# Patient Record
Sex: Female | Born: 1970 | Race: White | Hispanic: Yes | Marital: Married | Smoking: Never smoker
Health system: Southern US, Community
[De-identification: ages and names within clinical notes are randomized; demographics above are authoritative.]

---

## 1999-10-22 ENCOUNTER — Other Ambulatory Visit: Admission: RE | Admit: 1999-10-22 | Discharge: 1999-10-22 | Payer: Self-pay | Admitting: Obstetrics & Gynecology

## 2000-02-13 ENCOUNTER — Encounter: Admission: RE | Admit: 2000-02-13 | Discharge: 2000-02-13 | Payer: Self-pay | Admitting: Obstetrics and Gynecology

## 2000-02-13 ENCOUNTER — Encounter: Payer: Self-pay | Admitting: Obstetrics and Gynecology

## 2000-04-12 ENCOUNTER — Encounter (INDEPENDENT_AMBULATORY_CARE_PROVIDER_SITE_OTHER): Payer: Self-pay | Admitting: Specialist

## 2000-04-12 ENCOUNTER — Encounter: Payer: Self-pay | Admitting: Obstetrics

## 2000-04-12 ENCOUNTER — Inpatient Hospital Stay (HOSPITAL_COMMUNITY): Admission: AD | Admit: 2000-04-12 | Discharge: 2000-04-15 | Payer: Self-pay | Admitting: Obstetrics

## 2002-05-05 ENCOUNTER — Other Ambulatory Visit: Admission: RE | Admit: 2002-05-05 | Discharge: 2002-05-05 | Payer: Self-pay | Admitting: Obstetrics and Gynecology

## 2002-09-20 ENCOUNTER — Encounter: Admission: RE | Admit: 2002-09-20 | Discharge: 2002-09-20 | Payer: Self-pay | Admitting: *Deleted

## 2002-09-25 ENCOUNTER — Ambulatory Visit (HOSPITAL_COMMUNITY): Admission: RE | Admit: 2002-09-25 | Discharge: 2002-09-25 | Payer: Self-pay | Admitting: *Deleted

## 2002-09-27 ENCOUNTER — Encounter: Admission: RE | Admit: 2002-09-27 | Discharge: 2002-09-27 | Payer: Self-pay | Admitting: *Deleted

## 2002-10-11 ENCOUNTER — Encounter: Admission: RE | Admit: 2002-10-11 | Discharge: 2002-10-11 | Payer: Self-pay | Admitting: *Deleted

## 2002-10-25 ENCOUNTER — Encounter: Admission: RE | Admit: 2002-10-25 | Discharge: 2002-10-25 | Payer: Self-pay | Admitting: *Deleted

## 2002-10-25 ENCOUNTER — Ambulatory Visit (HOSPITAL_COMMUNITY): Admission: RE | Admit: 2002-10-25 | Discharge: 2002-10-25 | Payer: Self-pay | Admitting: *Deleted

## 2002-11-01 ENCOUNTER — Encounter (HOSPITAL_COMMUNITY): Admission: RE | Admit: 2002-11-01 | Discharge: 2002-11-15 | Payer: Self-pay | Admitting: *Deleted

## 2002-11-01 ENCOUNTER — Encounter: Admission: RE | Admit: 2002-11-01 | Discharge: 2002-11-01 | Payer: Self-pay | Admitting: *Deleted

## 2002-11-15 ENCOUNTER — Encounter: Admission: RE | Admit: 2002-11-15 | Discharge: 2002-11-15 | Payer: Self-pay | Admitting: *Deleted

## 2002-11-20 ENCOUNTER — Encounter (INDEPENDENT_AMBULATORY_CARE_PROVIDER_SITE_OTHER): Payer: Self-pay | Admitting: Specialist

## 2002-11-20 ENCOUNTER — Inpatient Hospital Stay (HOSPITAL_COMMUNITY): Admission: AD | Admit: 2002-11-20 | Discharge: 2002-11-23 | Payer: Self-pay | Admitting: Obstetrics and Gynecology

## 2002-11-27 ENCOUNTER — Inpatient Hospital Stay (HOSPITAL_COMMUNITY): Admission: AD | Admit: 2002-11-27 | Discharge: 2002-11-27 | Payer: Self-pay | Admitting: Family Medicine

## 2003-01-02 ENCOUNTER — Encounter: Admission: RE | Admit: 2003-01-02 | Discharge: 2003-01-02 | Payer: Self-pay | Admitting: Obstetrics and Gynecology

## 2003-01-16 ENCOUNTER — Encounter: Admission: RE | Admit: 2003-01-16 | Discharge: 2003-01-16 | Payer: Self-pay | Admitting: Obstetrics and Gynecology

## 2005-09-11 ENCOUNTER — Other Ambulatory Visit: Admission: RE | Admit: 2005-09-11 | Discharge: 2005-09-11 | Payer: Self-pay | Admitting: Gynecology

## 2007-12-31 ENCOUNTER — Emergency Department (HOSPITAL_COMMUNITY): Admission: EM | Admit: 2007-12-31 | Discharge: 2007-12-31 | Payer: Self-pay | Admitting: Emergency Medicine

## 2011-01-09 NOTE — Discharge Summary (Signed)
[facility]  Patient:    Sydney Sandoval, Sydney Sandoval                         MRN: [id number] Adm. Date:  [id number] Disc. Date: [id number] Attending:  Tammi Sou Dictator:   Pricilla Holm, M.D.                           Discharge Summary  PROCEDURES:                   Primary low transverse cesarean section.  HOSPITAL COURSE:              Ms. Duba is a 40 year old Hispanic female who presented to maternity admissions from clinic.  She was admitted for induction of labor at 37-2/7 for severe oligohydramnios and symmetric IUGR.  She was given Cervidil overnight for cervical ripening.  The patient was found to have fetal bradycardia down to approximately 60 beats per minute x 8 minutes which was preceded by late deceleration.  Decision was made to proceed to C section. The patient received a primary low transverse cesarean section, surgeon Dr. Tamela Oddi with findings of viable female with Apgars of 8 at 1 minute, 9 at 5 minutes.  The patient had no complications following the C section, and she was taken to recovery.  The patient received routine postoperative care. She did not desire circumcision for her female.  She has been both breast and bottle feeding and desire oral contraceptive pills upon discharge.  On the morning of August 23, the patient was found to be doing well.  She will have her staples removed today prior to leaving to go home.  Her female will continue to stay in the NICU secondary to apnea.  DISCHARGE CONDITION:          Good.  DISPOSITION:                  Discharged home with family.  DISCHARGE MEDICATIONS:        1. Micronor 1 tablet p.o. q.d. as directed,                                  starting September 3.                               2. Tylox 1 tablet p.o. q.4-6h. p.r.n. pain.                               3. Motrin 600 mg 1 tablet p.o. q.6h. p.r.n.                                  pain.  DISCHARGE INSTRUCTIONS:        The patient was given instructions on activity, diet, wound care, as well as symptoms to warrant further treatment.  FOLLOWUP:                     The patient will follow up in six weeks with [facility].  The patient voiced agreement and consent to above plan and had no further questions. DD:  04/15/00 TD:  04/16/00 Job: [id number]

## 2011-01-09 NOTE — Discharge Summary (Signed)
   Sydney Sandoval, Sydney Sandoval                     ACCOUNT NO.:  [id number]   MEDICAL RECORD NO.:  [id number]                   PATIENT TYPE:  INP   LOCATION:  9122                                 FACILITY:  [facility]   PHYSICIAN:  Phil D. Okey Dupre, M.D.                  DATE OF BIRTH:  09-Jan-1971   DATE OF ADMISSION:  11/20/2002  DATE OF DISCHARGE:  11/23/2002                                 DISCHARGE SUMMARY   HISTORY:  The patient is 40 year old gravida 2, para 1-0-0-1 at the time of  admission, had a previous C-section, was admitted for a repeat cesarean  section for a twin pregnancy at term.  She underwent low-transverse cesarean  section on the day of admission.  Two female twins both vertex presentation.  The first one 5 pounds 15 ounces, second 5 pounds 12 ounces.  Patient a  completely non-eventful postoperative course, completely afebrile and was  discharged on the a.m. of the third postoperative day to home.   DISCHARGE INSTRUCTIONS:  Given to the patient as to activity, follow up and  diet.   DISCHARGE PHYSICAL EXAMINATION:  VITAL SIGNS:  At the time of discharge she  was afebrile with a blood pressure of 120/80, 76 pulse, temperature 98.6.  LUNGS:  Clear to auscultation percussion.  HEART:  No murmur.  Normal sinus rhythm.  ABDOMEN:  Soft.  Flat.  Normally tender for third day post C-section with a  clean, well-healing incision.  Staples had been removed on the day of  discharge.   FOLLOW UP:  She will be followed up in [facility] in six weeks.   DISCHARGE MEDICATIONS:  She is discharged on Percocet for pain.                                                Phil D. Okey Dupre, M.D.    PDR/MEDQ  D:  12/27/2002  T:  12/27/2002  Job:  [id number]

## 2011-01-09 NOTE — Op Note (Signed)
[facility]  Patient:    Sydney Sandoval, Sydney Sandoval                         MRN: [id number] Proc. Date: 04/13/00 Adm. Date:  [id number] Disc. Date: [id number] Attending:  Tammi Sou                           Operative Report  PREOPERATIVE DIAGNOSES:       1. 37 week intrauterine pregnancy.                               2. Oligohydramnios.                               3. Nonreassuring fetal heart tracing with late                                  decelerations and prolonged deceleration.  POSTOPERATIVE DIAGNOSES:      1. 37 week intrauterine pregnancy.                               2. Oligohydramnios.                               3. Nonreassuring fetal heart tracing with late                                  decelerations and prolonged deceleration.  PROCEDURES:                   Primary low transverse cesarean section via Pfannenstiel.  SURGEON:                      Roseanna Rainbow, M.D.  ANESTHESIA:                   Spinal.  COMPLICATIONS:                None.  ESTIMATED BLOOD LOSS:         800 cc.  FLUIDS:                       1500 cc of lactated Ringers.  URINE OUTPUT:                 300 cc of clear urine at the end of the procedure.  INDICATIONS:                  The patient is a 40 year old, G1, P0, at 37 weeks with oligohydramnios, undergoing induction.  With cervical ripening with Cervidil, the patient began having uterine contractions without any cervical dilatation.  The fetal heart tracing was reviewed and there were late decelerations followed by an 8 minute prolonged deceleration with a nadir below 60.  There was recovery after this deceleration without a significant tachycardia.  As delivery was remote, the decision was made to proceed with a primary cesarean delivery.  FINDINGS:  Female infant in cephalic presentation with a nuchal cord, loose.  Pediatrics were present at delivery.  Normal  uterus, tubes, and ovaries.  DESCRIPTION OF PROCEDURE:     The patient was taken to the operating room where a spinal anesthetic was administered and found to be adequate.  She was then prepared and draped in the normal sterile fashion with the dorsosupine position with a leftward tilt.  A Pfannenstiel skin incision was then made with the scalpel and carried through to the underlying layer of fascia.  The fascia was incised in the midline and the incision extended laterally with Mayo scissors.  The superior aspect of the fascia incision was then grasped with Kocher clamps, elevated, and the underlying rectus muscles dissected off. Attention was then turned to the inferior aspect of this incision, which was manipulated in a similar fashion.  The rectus fascia was separated in the midline and the peritoneum identified, tented up, and entered sharply with Metzenbaum scissors.  The peritoneal incision was then extended superiorly and inferiorly with good visualization of the bladder.  The bladder blade was then inserted and the vesicouterine peritoneum identified, grasped with pickups, and entered sharply.  This incision was then extended laterally and the bladder flap created digitally.  The bladder blade was then reinserted in the lower uterine segment and incised in a transverse fashion with the scalpel. The uterine incision was then extended laterally with the bandage scissors. The bladder blade was then removed and the infants head delivered atraumatically.  The nose and mouth were suctioned with the bulb suction and the cord clamped and cut.  The infant was handed off to the awaiting pediatricians.  Cord gases were sent.  The placenta was then removed.  The uterus was exteriorized and cleared of all clots and debris.  The uterine incision was then repaired with 0 Monocryl in a running lock fashion.  The uterus was returned to the abdomen.  The gutters were cleared of all clot. The  fascia was reapproximated with 0 Vicryl in a running fashion.  The skin was closed with staples.  The patient tolerated the procedure well.  The sponge, lap, and needle counts were correct x 2.  One gram of cefazolin was given at cord clamp.  The patient was taken to the PACU in stable condition. DD:  04/15/00 TD:  04/16/00 Job: [id number]

## 2011-01-09 NOTE — Op Note (Signed)
   Sydney Sandoval, Sydney Sandoval                     ACCOUNT NO.:  [id number]   MEDICAL RECORD NO.:  [id number]                   PATIENT TYPE:  INP   LOCATION:  9198                                 FACILITY:  [facility]   PHYSICIAN:  Phil D. Okey Dupre, M.D.                  DATE OF BIRTH:  07/26/1971   DATE OF PROCEDURE:  11/20/2002  DATE OF DISCHARGE:                                 OPERATIVE REPORT   PROCEDURE:  Low transverse cesarean section.   PREOPERATIVE DIAGNOSES:  1. Previous cesarean section.  2. Term twin pregnancy.   POSTOPERATIVE DIAGNOSES:  1. Previous cesarean section.  2. Term twin pregnancy.   SURGEON:  Javier Glazier. Okey Dupre, M.D.   OPERATIVE FINDINGS:  Twin females.  A weighing 5 pounds 15 ounces.  B  weighing 5 pounds 12 ounces.  Both in vertex presentations.   PROCEDURE:  Under satisfactory spinal anesthesia the patient in a dorsal  supine position with a Foley catheter in the urinary bladder the abdomen was  prepped and draped in the usual sterile manner, entered through a previous  transverse incisional scar 3 cm above the symphysis pubis and extending for  a total length of 16 cm.  The abdomen was entered by layers and entered into  the peritoneal cavity.  The visceroperitoneum on the anterior surface of the  uterus opened transversely by sharp dissection and the bladder pushed away  from the lower uterine segment was entered by sharp and blunt dissection and  from an LOT presentation the first twin was easily delivered.  Cord doubly  clamped, divided, the baby handed to pediatrician.  The second baby came  down entering ROT presentation and was easily delivered.  Cord doubly  clamped, divided, baby handed to pediatrician.  Samples of blood taken from  each individual cord.  The placenta was spontaneously removed and the uterus  explored and closed with a continuous running locked 0 Vicryl on an  atraumatic needle.  Area was observed for bleeding, none was noted and the  fascia closed from either incision meeting at the mid point with continuous  alternating locked 0 Vicryl on an atraumatic needle.  Subcutaneous bleeders  were controlled with hot cautery and skin staples for skin edge  approximation.  Dry sterile dressing was applied.  The patient tolerated the  procedure well with about 800 mL blood loss and was transferred to recovery  room in satisfactory condition.                                               Phil D. Okey Dupre, M.D.    PDR/MEDQ  D:  11/20/2002  T:  11/20/2002  Job:  [id number]

## 2014-09-28 ENCOUNTER — Other Ambulatory Visit: Payer: Self-pay | Admitting: Specialist

## 2014-09-28 ENCOUNTER — Ambulatory Visit
Admission: RE | Admit: 2014-09-28 | Discharge: 2014-09-28 | Disposition: A | Discharge status: Home / Self Care | Payer: No Typology Code available for payment source | Source: Ambulatory Visit | Attending: Specialist | Admitting: Specialist

## 2014-09-28 DIAGNOSIS — R7611 Nonspecific reaction to tuberculin skin test without active tuberculosis: Secondary | ICD-10-CM

## 2016-03-08 IMAGING — CR DG CHEST 1V
1 series · 1 of 1 positions shown · non-contrast
Comparison: None.

CLINICAL DATA: 43-year-old female with positive TB test. Evaluate
for tuberculosis.

EXAM:
CHEST  1 VIEW

[view not recorded]
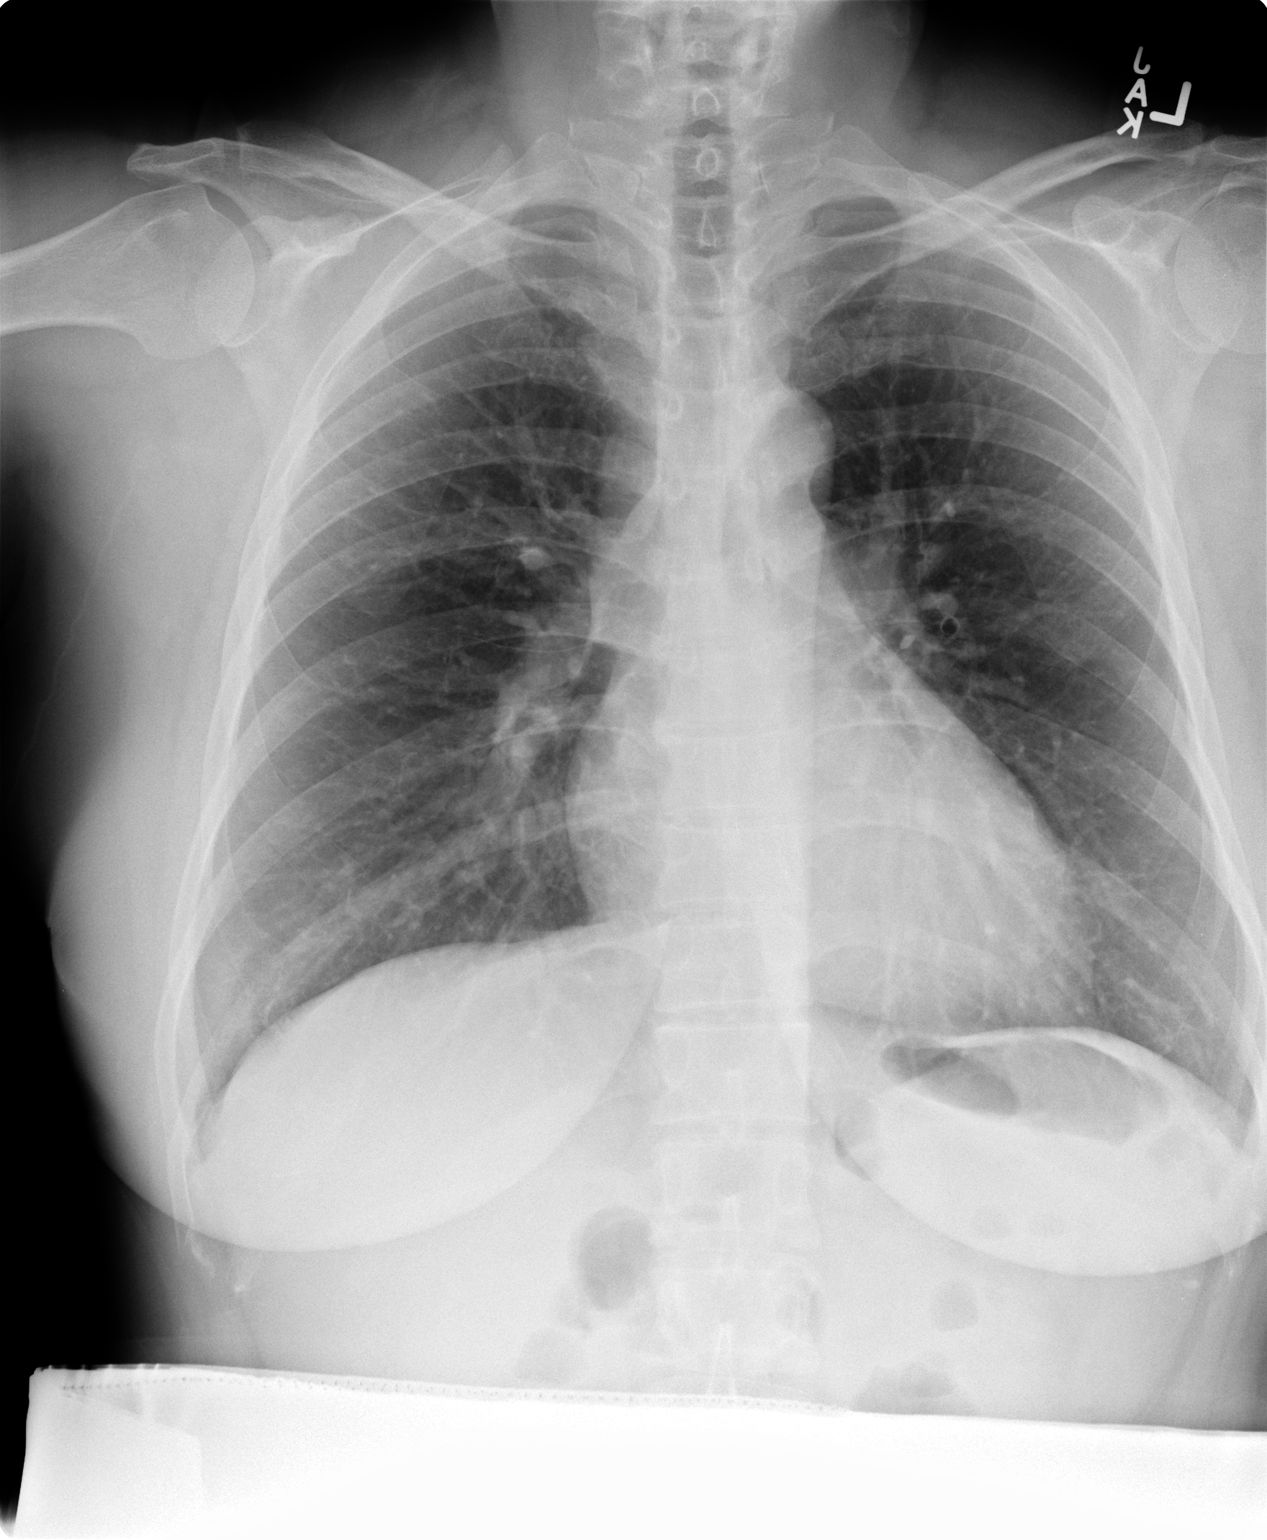

[1 of 1 positions shown; findings below may reference images not displayed]

FINDINGS: The lungs are clear and negative for focal airspace consolidation,
pulmonary edema or suspicious pulmonary nodule. No pleural effusion
or pneumothorax. Cardiac and mediastinal contours are within normal
limits. No acute fracture or lytic or blastic osseous lesions. The
visualized upper abdominal bowel gas pattern is unremarkable.
IMPRESSION: Negative chest x-ray.

## 2016-08-14 ENCOUNTER — Telehealth: Payer: Self-pay | Admitting: Licensed Clinical Social Worker

## 2016-08-14 NOTE — Progress Notes (Signed)
LCSW received call from patient via Vassie LollAlva her interpreter.   She wanting LCSW to assist her with getting a message to her doctor states she wants to change doctors.  In reviewing patient's chart [facility]  Informed patient that LCSW was not connected to her PCP office.  She states she dialed the wrong case worker.     Sammuel Hineseborah Skarlette Lattner, LCSW Licensed Clinical Social Worker [facility]   [phone] 3:25 PM

## 2018-04-12 ENCOUNTER — Other Ambulatory Visit: Payer: Self-pay

## 2018-04-12 ENCOUNTER — Emergency Department (HOSPITAL_BASED_OUTPATIENT_CLINIC_OR_DEPARTMENT_OTHER): Payer: Self-pay

## 2018-04-12 ENCOUNTER — Encounter (HOSPITAL_BASED_OUTPATIENT_CLINIC_OR_DEPARTMENT_OTHER): Payer: Self-pay

## 2018-04-12 ENCOUNTER — Inpatient Hospital Stay (HOSPITAL_BASED_OUTPATIENT_CLINIC_OR_DEPARTMENT_OTHER)
Admission: EM | Admit: 2018-04-12 | Discharge: 2018-04-16 | DRG: 418 | Disposition: A | Discharge status: Home / Self Care | Payer: Self-pay | Attending: Internal Medicine | Admitting: Internal Medicine

## 2018-04-12 DIAGNOSIS — K8062 Calculus of gallbladder and bile duct with acute cholecystitis without obstruction: Principal | ICD-10-CM | POA: Diagnosis present

## 2018-04-12 DIAGNOSIS — D509 Iron deficiency anemia, unspecified: Secondary | ICD-10-CM | POA: Diagnosis present

## 2018-04-12 DIAGNOSIS — R945 Abnormal results of liver function studies: Secondary | ICD-10-CM

## 2018-04-12 DIAGNOSIS — R1011 Right upper quadrant pain: Secondary | ICD-10-CM

## 2018-04-12 DIAGNOSIS — D62 Acute posthemorrhagic anemia: Secondary | ICD-10-CM | POA: Diagnosis present

## 2018-04-12 DIAGNOSIS — K760 Fatty (change of) liver, not elsewhere classified: Secondary | ICD-10-CM | POA: Diagnosis present

## 2018-04-12 DIAGNOSIS — R7989 Other specified abnormal findings of blood chemistry: Secondary | ICD-10-CM | POA: Diagnosis present

## 2018-04-12 DIAGNOSIS — R933 Abnormal findings on diagnostic imaging of other parts of digestive tract: Secondary | ICD-10-CM

## 2018-04-12 DIAGNOSIS — Z419 Encounter for procedure for purposes other than remedying health state, unspecified: Secondary | ICD-10-CM

## 2018-04-12 DIAGNOSIS — Z6832 Body mass index (BMI) 32.0-32.9, adult: Secondary | ICD-10-CM

## 2018-04-12 DIAGNOSIS — E876 Hypokalemia: Secondary | ICD-10-CM | POA: Diagnosis present

## 2018-04-12 DIAGNOSIS — Z98891 History of uterine scar from previous surgery: Secondary | ICD-10-CM

## 2018-04-12 DIAGNOSIS — E669 Obesity, unspecified: Secondary | ICD-10-CM | POA: Diagnosis present

## 2018-04-12 DIAGNOSIS — K810 Acute cholecystitis: Secondary | ICD-10-CM

## 2018-04-12 DIAGNOSIS — N920 Excessive and frequent menstruation with regular cycle: Secondary | ICD-10-CM | POA: Diagnosis present

## 2018-04-12 DIAGNOSIS — K8050 Calculus of bile duct without cholangitis or cholecystitis without obstruction: Secondary | ICD-10-CM

## 2018-04-12 DIAGNOSIS — M1990 Unspecified osteoarthritis, unspecified site: Secondary | ICD-10-CM | POA: Diagnosis present

## 2018-04-12 DIAGNOSIS — R17 Unspecified jaundice: Secondary | ICD-10-CM | POA: Diagnosis present

## 2018-04-12 LAB — COMPREHENSIVE METABOLIC PANEL
ALT: 882 U/L — ABNORMAL HIGH (ref 0–44)
AST: 834 U/L — AB (ref 15–41)
Albumin: 4.3 g/dL (ref 3.5–5.0)
Alkaline Phosphatase: 325 U/L — ABNORMAL HIGH (ref 38–126)
Anion gap: 10 (ref 5–15)
BUN: 10 mg/dL (ref 6–20)
CHLORIDE: 107 mmol/L (ref 98–111)
CO2: 25 mmol/L (ref 22–32)
Calcium: 9.2 mg/dL (ref 8.9–10.3)
Creatinine, Ser: 0.38 mg/dL — ABNORMAL LOW (ref 0.44–1.00)
GFR calc Af Amer: >60 mL/min (ref >60)
GFR calc non Af Amer: >60 mL/min (ref >60)
Glucose, Bld: 98 mg/dL (ref 70–99)
Potassium: 3.4 mmol/L — ABNORMAL LOW (ref 3.5–5.1)
Sodium: 142 mmol/L (ref 135–145)
Total Bilirubin: 2.6 mg/dL — ABNORMAL HIGH (ref 0.3–1.2)
Total Protein: 8 g/dL (ref 6.5–8.1)

## 2018-04-12 LAB — CBC WITH DIFFERENTIAL/PLATELET
Basophils Absolute: 0 10*3/uL (ref 0.0–0.1)
Basophils Relative: 0 %
EOS ABS: 0 10*3/uL (ref 0.0–0.7)
Eosinophils Relative: 0 %
HCT: 28.6 % — ABNORMAL LOW (ref 36.0–46.0)
Hemoglobin: 8.3 g/dL — ABNORMAL LOW (ref 12.0–15.0)
LYMPHS ABS: 1.2 10*3/uL (ref 0.7–4.0)
Lymphocytes Relative: 9 %
MCH: 17.1 pg — ABNORMAL LOW (ref 26.0–34.0)
MCHC: 29 g/dL — ABNORMAL LOW (ref 30.0–36.0)
MCV: 59.1 fL — ABNORMAL LOW (ref 78.0–100.0)
Monocytes Absolute: 0.9 10*3/uL (ref 0.1–1.0)
Monocytes Relative: 7 %
NEUTROS PCT: 84 %
Neutro Abs: 11 10*3/uL — ABNORMAL HIGH (ref 1.7–7.7)
Platelets: 415 10*3/uL — ABNORMAL HIGH (ref 150–400)
RBC: 4.84 MIL/uL (ref 3.87–5.11)
RDW: 21.1 % — ABNORMAL HIGH (ref 11.5–15.5)
WBC: 13.1 10*3/uL — AB (ref 4.0–10.5)

## 2018-04-12 LAB — URINALYSIS, ROUTINE W REFLEX MICROSCOPIC
BILIRUBIN URINE: NEGATIVE
Glucose, UA: NEGATIVE mg/dL
KETONES UR: 15 mg/dL — AB
LEUKOCYTES UA: NEGATIVE
NITRITE: NEGATIVE
Protein, ur: NEGATIVE mg/dL
Specific Gravity, Urine: 1.02 (ref 1.005–1.030)
pH: 6 (ref 5.0–8.0)

## 2018-04-12 LAB — URINALYSIS, MICROSCOPIC (REFLEX): WBC, UA: NONE SEEN WBC/hpf (ref 0–5)

## 2018-04-12 LAB — LIPASE, BLOOD: Lipase: 133 U/L — ABNORMAL HIGH (ref 11–51)

## 2018-04-12 LAB — PREGNANCY, URINE: PREG TEST UR: NEGATIVE

## 2018-04-12 MED ORDER — FAMOTIDINE IN NACL 20-0.9 MG/50ML-% IV SOLN
20.0000 mg | Freq: Two times a day (BID) | INTRAVENOUS | Status: DC
  Administered 2018-04-12 – 2018-04-16 (×7): 20 mg via INTRAVENOUS
  Filled 2018-04-12 (×7): qty 50

## 2018-04-12 MED ORDER — ONDANSETRON HCL 4 MG PO TABS: 4.0000 mg | ORAL_TABLET | Freq: Four times a day (QID) | ORAL | Status: DC | PRN

## 2018-04-12 MED ORDER — METRONIDAZOLE IN NACL 5-0.79 MG/ML-% IV SOLN
500.0000 mg | Freq: Three times a day (TID) | INTRAVENOUS | Status: DC
  Administered 2018-04-12 – 2018-04-16 (×10): 500 mg via INTRAVENOUS
  Filled 2018-04-12 (×11): qty 100

## 2018-04-12 MED ORDER — SODIUM CHLORIDE 0.9 % IV SOLN
INTRAVENOUS | Status: DC | PRN
  Administered 2018-04-12: 250 mL via INTRAVENOUS
  Administered 2018-04-13: 1000 mL via INTRAVENOUS

## 2018-04-12 MED ORDER — FENTANYL CITRATE (PF) 100 MCG/2ML IJ SOLN
25.0000 ug | INTRAMUSCULAR | Status: DC | PRN
  Administered 2018-04-14: 25 ug via INTRAVENOUS
  Filled 2018-04-12: qty 2

## 2018-04-12 MED ORDER — POTASSIUM CHLORIDE IN NACL 20-0.9 MEQ/L-% IV SOLN
INTRAVENOUS | Status: AC
  Administered 2018-04-12: 23:00:00 via INTRAVENOUS
  Filled 2018-04-12: qty 1000

## 2018-04-12 MED ORDER — ACETAMINOPHEN 325 MG PO TABS
650.0000 mg | ORAL_TABLET | Freq: Four times a day (QID) | ORAL | Status: DC | PRN
  Administered 2018-04-12 – 2018-04-13 (×2): 650 mg via ORAL
  Filled 2018-04-12 (×2): qty 2

## 2018-04-12 MED ORDER — POTASSIUM CHLORIDE CRYS ER 20 MEQ PO TBCR
20.0000 meq | EXTENDED_RELEASE_TABLET | Freq: Once | ORAL | Status: AC
  Administered 2018-04-12: 20 meq via ORAL
  Filled 2018-04-12: qty 1

## 2018-04-12 MED ORDER — ONDANSETRON HCL 4 MG/2ML IJ SOLN
4.0000 mg | Freq: Once | INTRAMUSCULAR | Status: AC
  Administered 2018-04-14: 4 mg via INTRAVENOUS
  Filled 2018-04-12 (×2): qty 2

## 2018-04-12 MED ORDER — SODIUM CHLORIDE 0.9 % IV SOLN
2.0000 g | Freq: Once | INTRAVENOUS | Status: AC
  Administered 2018-04-12: 2 g via INTRAVENOUS
  Filled 2018-04-12: qty 20

## 2018-04-12 MED ORDER — SODIUM CHLORIDE 0.9 % IV BOLUS
1000.0000 mL | Freq: Once | INTRAVENOUS | Status: AC
  Administered 2018-04-12: 1000 mL via INTRAVENOUS

## 2018-04-12 MED ORDER — SODIUM CHLORIDE 0.9 % IV SOLN
2.0000 g | INTRAVENOUS | Status: DC
  Administered 2018-04-14 – 2018-04-15 (×3): 2 g via INTRAVENOUS
  Filled 2018-04-12 (×2): qty 2
  Filled 2018-04-12: qty 20
  Filled 2018-04-12: qty 2
  Filled 2018-04-12: qty 20

## 2018-04-12 MED ORDER — ONDANSETRON HCL 4 MG/2ML IJ SOLN: 4.0000 mg | Freq: Four times a day (QID) | INTRAMUSCULAR | Status: DC | PRN

## 2018-04-12 MED ORDER — ACETAMINOPHEN 650 MG RE SUPP: 650.0000 mg | Freq: Four times a day (QID) | RECTAL | Status: DC | PRN

## 2018-04-12 NOTE — ED Notes (Signed)
Pt does not want zofran at this time. States she is not nauseated.

## 2018-04-12 NOTE — ED Notes (Signed)
Pt refused zofran for now

## 2018-04-12 NOTE — ED Notes (Signed)
Report given to Electronic Data Systemsachel RN 5 west

## 2018-04-12 NOTE — ED Provider Notes (Signed)
[facility] EMERGENCY DEPARTMENT Provider Note   CSN: [id number] Arrival date & time: 04/12/18  1557     History   Chief Complaint Chief Complaint  Patient presents with  . Abdominal Pain    HPI Sydney Sandoval is a 47 y.o. female.  HPI Sydney Sandoval is a 47 y.o. female presents to emergency department with complaint of abdominal pain, nausea, vomiting.  Patient states her pain started yesterday.  She states it improved and she was able to sleep, but it worsened again this morning.  Today she has had several episodes of nausea and vomiting.  She states she is unable to eat.  Denies any diarrhea or changes in her bowels.  No vaginal symptoms.  No urinary symptoms.  She has not tried any medications prior to coming in.  Pain is mainly in the upper right abdomen and does not radiate.  No fever or chills.  No history of similar pain in the past.  History of 2 C-sections, otherwise no abdominal surgeries.  History reviewed. No pertinent past medical history.  There are no active problems to display for this patient.   Past Surgical History:  Procedure Laterality Date  . CESAREAN SECTION       OB History   None      Home Medications    Prior to Admission medications   Not on File    Family History No family history on file.  Social History Social History   Tobacco Use  . Smoking status: Never Smoker  . Smokeless tobacco: Never Used  Substance Use Topics  . Alcohol use: Yes    Comment: occ  . Drug use: Never     Allergies   Patient has no known allergies.   Review of Systems Review of Systems  Constitutional: Negative for chills and fever.  Respiratory: Negative for cough, chest tightness and shortness of breath.   Cardiovascular: Negative for chest pain, palpitations and leg swelling.  Gastrointestinal: Positive for abdominal pain, nausea and vomiting. Negative for diarrhea.  Genitourinary: Negative for dysuria, flank pain, pelvic pain, vaginal  bleeding, vaginal discharge and vaginal pain.  Musculoskeletal: Negative for arthralgias, myalgias, neck pain and neck stiffness.  Skin: Negative for rash.  Neurological: Negative for dizziness, weakness and headaches.  All other systems reviewed and are negative.    Physical Exam Updated Vital Signs BP (!) 101/53 (BP Location: Left Arm)   Pulse 66   Temp 98.3 F (36.8 C) (Oral)   Resp 16   Ht 5' (1.524 m)   Wt 66.2 kg   LMP 04/04/2018   SpO2 100%   BMI 28.51 kg/m   Physical Exam  Constitutional: She appears well-developed and well-nourished. No distress.  HENT:  Head: Normocephalic.  Eyes: Conjunctivae are normal.  Neck: Neck supple.  Cardiovascular: Normal rate, regular rhythm and normal heart sounds.  Pulmonary/Chest: Effort normal and breath sounds normal. No respiratory distress. She has no wheezes. She has no rales.  Abdominal: Soft. Bowel sounds are normal. She exhibits no distension. There is tenderness in the right upper quadrant. There is no rebound.  Musculoskeletal: She exhibits no edema.  Neurological: She is alert.  Skin: Skin is warm and dry.  Psychiatric: She has a normal mood and affect. Her behavior is normal.  Nursing note and vitals reviewed.    ED Treatments / Results  Labs (all labs ordered are listed, but only abnormal results are displayed) Labs Reviewed  URINALYSIS, ROUTINE W REFLEX MICROSCOPIC  PREGNANCY, URINE  CBC WITH DIFFERENTIAL/PLATELET  COMPREHENSIVE METABOLIC PANEL  LIPASE, BLOOD    EKG None  Radiology No results found.  Procedures Procedures (including critical care time)  Medications Ordered in ED Medications  sodium chloride 0.9 % bolus 1,000 mL (has no administration in time range)  ondansetron (ZOFRAN) injection 4 mg (has no administration in time range)     Initial Impression / Assessment and Plan / ED Course  I have reviewed the triage vital signs and the nursing notes.  Pertinent labs & imaging results that  were available during my care of the patient were reviewed by me and considered in my medical decision making (see chart for details).     And with right upper quadrant pain that comes and goes.  Currently pain is minimal.  Exam is concerning for cholecystitis with right upper quadrant tenderness and positive Murphy sign.  Will get labs and ultrasound abdomen.   6:30 PM Elevated LFTs and lipase.  Elevated white blood cell count.  Ultrasound shows cholelithiasis with possible cholecystitis.  Common bile duct appears to be normal.  I will speak with gastroenterology regarding this patient and most likely admit to medicine.  Patient continues to feel comfortable at this time does not want any pain medications.  7:12 PM Spoke with Dr. Elnoria HowardHung with gastroenterology who advised to admit patient to get MRCP.  If MRCP is abnormal to consult him again.  If MRCP looks okay, patient will need general surgery consult.  I did initiate antibiotics since patient does have elevated white cell count and there is concern for possible cholecystitis.  Patient continues to be pain-free.  I spoke with general medicine they will admit patient.  Vitals:   04/13/18 0147 04/13/18 0550 04/13/18 1118 04/13/18 1700  BP:  (!) 104/59  112/60  Pulse:  81  77  Resp:  16  18  Temp: 100.3 F (37.9 C) 99.5 F (37.5 C)  98.9 F (37.2 C)  TempSrc: Oral Oral  Oral  SpO2:  95% 93% 99%  Weight:  71.3 kg    Height:          Final Clinical Impressions(s) / ED Diagnoses   Final diagnoses:  RUQ pain  Abnormal magnetic resonance cholangiopancreatography (MRCP)    ED Discharge Orders    None       Jaynie CrumbleKirichenko, Reynard Christoffersen, PA-C 04/13/18 2116    Melene PlanFloyd, Dan, DO 04/13/18 2324

## 2018-04-12 NOTE — ED Triage Notes (Signed)
C/o right side abd pain, n/v started yesterday-NAD-steady gait

## 2018-04-12 NOTE — H&P (Signed)
History and Physical    Sydney Sandoval WUJ:[id number] DOB: 14-Jan-1971 DOA: 04/12/2018  PCP: System, Pcp Not In   Patient coming from: Home, [facility]    Chief Complaint: Right-sided abdominal pain, N/V   HPI: Sydney Sandoval is a 47 y.o. female with medical history significant for arthritis and menorrhagia, now presenting to the emergency department for evaluation of epigastric and right upper quadrant abdominal pain with nausea and nonbloody vomiting.  Patient reports that she been in her usual state of health until yesterday when she experienced acute onset of severe pain in the epigastrium and right upper quadrant shortly after a meal.  This was accompanied by nausea with nonbloody vomiting.  Eventually, the symptoms eased off and she was able to get some sleep.  This morning, shortly after drinking some tea, she had a recurrence in her symptoms, again severe, sharp, localized to the epigastrium and right upper quadrant, and associated with nausea and nonbloody vomiting.  She denies experiencing these symptoms previously, denies chest pain or shortness of breath, and denies diarrhea.  States that she has been treated with prednisone in the past for her arthritis.  [facility] ED Course: Upon arrival to the ED, patient is found to be afebrile, saturating well on room air, and with vitals otherwise stable.  Chemistry panel was notable for a slight hypokalemia, alkaline phosphatase of 325, lipase 133, AST 834, ALT 882, and total bilirubin 2.6.  CBC features a leukocytosis with WBC 13,100 and a microcytic anemia with hemoglobin of 8.3 and MCV of 59.1.  Patient was given a liter of normal saline and 2 g of IV Rocephin in the ED.  Gastroenterology was consulted by the ED physician and recommended medical admission, MRCP, and advised to reconsult with GI if MRCP abnormal, and otherwise consult with general surgery for cholecystectomy.  Patient remained hemodynamically stable and will be  [facility] for ongoing evaluation and management.  Review of Systems:  All other systems reviewed and apart from HPI, are negative.  History reviewed. No pertinent past medical history.  Past Surgical History:  Procedure Laterality Date  . CESAREAN SECTION       reports that she has never smoked. She has never used smokeless tobacco. She reports that she drinks alcohol. She reports that she does not use drugs.  No Known Allergies  History reviewed. No pertinent family history.   Prior to Admission medications   Not on File    Physical Exam: Vitals:   04/12/18 1606 04/12/18 1816 04/12/18 1928 04/12/18 2027  BP:  (!) 146/129 (!) 120/49 (!) 93/50  Pulse:  100 97 97  Resp:  20  18  Temp:      TempSrc:      SpO2:  100% 95% 98%  Weight: 66.2 kg     Height: 5' (1.524 m)         Constitutional: NAD, appears uncomfortable Eyes: PERTLA, lids and conjunctivae normal ENMT: Mucous membranes are moist. Posterior pharynx clear of any exudate or lesions.   Neck: normal, supple, no masses, no thyromegaly Respiratory: clear to auscultation bilaterally, no wheezing, no crackles. Normal respiratory effort.   Cardiovascular: Rate ~110 and regular. No significant JVD. Abdomen: No distension, soft, tender in RUQ. Bowel sounds active.  Musculoskeletal: no clubbing / cyanosis. No joint deformity upper and lower extremities.   Skin: no significant rashes, lesions, ulcers. Warm, dry, well-perfused. Neurologic: CN 2-12 grossly intact. Sensation intactl. Strength 5/5 in all  4 limbs.  Psychiatric: Alert and oriented x 3. Pleasant and cooperative.     Labs on Admission: I have personally reviewed following labs and imaging studies  CBC: Recent Labs  Lab 04/12/18 1700  WBC 13.1*  NEUTROABS 11.0*  HGB 8.3*  HCT 28.6*  MCV 59.1*  PLT 415*   Basic Metabolic Panel: Recent Labs  Lab 04/12/18 1700  NA 142  K 3.4*  CL 107  CO2 25  GLUCOSE 98  BUN 10    CREATININE 0.38*  CALCIUM 9.2   GFR: Estimated Creatinine Clearance: 75.4 mL/min (A) (by C-G formula based on SCr of 0.38 mg/dL (L)). Liver Function Tests: Recent Labs  Lab 04/12/18 1700  AST 834*  ALT 882*  ALKPHOS 325*  BILITOT 2.6*  PROT 8.0  ALBUMIN 4.3   Recent Labs  Lab 04/12/18 1700  LIPASE 133*   No results for input(s): AMMONIA in the last 168 hours. Coagulation Profile: No results for input(s): INR, PROTIME in the last 168 hours. Cardiac Enzymes: No results for input(s): CKTOTAL, CKMB, CKMBINDEX, TROPONINI in the last 168 hours. BNP (last 3 results) No results for input(s): PROBNP in the last 8760 hours. HbA1C: No results for input(s): HGBA1C in the last 72 hours. CBG: No results for input(s): GLUCAP in the last 168 hours. Lipid Profile: No results for input(s): CHOL, HDL, LDLCALC, TRIG, CHOLHDL, LDLDIRECT in the last 72 hours. Thyroid Function Tests: No results for input(s): TSH, T4TOTAL, FREET4, T3FREE, THYROIDAB in the last 72 hours. Anemia Panel: No results for input(s): VITAMINB12, FOLATE, FERRITIN, TIBC, IRON, RETICCTPCT in the last 72 hours. Urine analysis:    Component Value Date/Time   COLORURINE YELLOW 04/12/2018 1612   APPEARANCEUR CLEAR 04/12/2018 1612   LABSPEC 1.020 04/12/2018 1612   PHURINE 6.0 04/12/2018 1612   GLUCOSEU NEGATIVE 04/12/2018 1612   HGBUR SMALL (A) 04/12/2018 1612   BILIRUBINUR NEGATIVE 04/12/2018 1612   KETONESUR 15 (A) 04/12/2018 1612   PROTEINUR NEGATIVE 04/12/2018 1612   NITRITE NEGATIVE 04/12/2018 1612   LEUKOCYTESUR NEGATIVE 04/12/2018 1612   Sepsis Labs: @LABRCNTIP (procalcitonin:4,lacticidven:4) )No results found for this or any previous visit (from the past 240 hour(s)).   Radiological Exams on Admission: US Abdomen Limited Ruq  Result Date: 04/12/2018 CLINICAL DATA:  RIGHT UPPER QUADRANT pain, nausea, and vomiting for 2 days. EXAM: ULTRASOUND ABDOMEN LIMITED RIGHT UPPER QUADRANT COMPARISON:  None available  FINDINGS: Gallbladder: Gallbladder is distended and contains numerous small stones, measuring on the order of 3 millimeters in diameter. Gallbladder wall is mildly thickened, 4.1 millimeters. No sonographic Murphy sign. No pericholecystic fluid. Common bile duct: Diameter: 1.6 millimeters Liver: No focal lesion identified. Within normal limits in parenchymal echogenicity. Portal vein is patent on color Doppler imaging with normal direction of blood flow towards the liver. IMPRESSION: 1. Cholelithiasis and mild gallbladder wall thickening. 2. Negative sonographic Murphy's sign. Electronically Signed   By: Norva Pavlov M.D.   On: 04/12/2018 18:01    EKG: Not performed.   Assessment/Plan   1. Acute cholecystitis; elevated LFT's; jaundice   - Presents with right-sided abdominal pain and is found to have leukocytosis, elevated transaminases in 800-range, and total bilirubin of 2.6  - RUQ [place] with cholelithiasis and wall-thickening; CBD not dilated  - ED physician discussed with GI who recommended medical admission, MRCP, re-consult GI if MRCP abnormal, and to consult with surgery if MRCP is normal  - She was given IVF hydration and IV Rocephin in ED; viral hepatitis panel was ordered   -  Continue empiric antibiotics, IVF hydration, antiemetics, analgesia, follow-up MRCP findings    2. Hypokalemia  - Serum potassium is 3.4 in setting of N/V  - Given 20 mEq oral potassium, KCl was added to IVF  - Repeat chemistries in am   3. Microcytic anemia  - Hgb is 8.3 on admission with MCV 59.1  - No prior labs available for comparison  - No acute bleeding, denies melena or hematochezia, reports heavy menstrual periods  - Type and screen, check anemia panel    DVT prophylaxis: SCD's  Code Status: Full  Family Communication: Discussed with patient   Consults called: GI  Admission status: Inpatient     Briscoe Deutscherimothy S Veronnica Hennings, MD [facility] Pager [phone]  If 7PM-7AM, please contact  night-coverage [email or web address] Password [email or web address]  04/12/2018, 10:13 PM

## 2018-04-13 ENCOUNTER — Inpatient Hospital Stay (HOSPITAL_COMMUNITY): Payer: Self-pay

## 2018-04-13 DIAGNOSIS — D500 Iron deficiency anemia secondary to blood loss (chronic): Secondary | ICD-10-CM

## 2018-04-13 LAB — IRON AND TIBC
IRON: 21 ug/dL — AB (ref 28–170)
Saturation Ratios: 5 % — ABNORMAL LOW (ref 10.4–31.8)
TIBC: 415 ug/dL (ref 250–450)
UIBC: 394 ug/dL

## 2018-04-13 LAB — COMPREHENSIVE METABOLIC PANEL
ALBUMIN: 3.3 g/dL — AB (ref 3.5–5.0)
ALT: 624 U/L — ABNORMAL HIGH (ref 0–44)
ANION GAP: 9 (ref 5–15)
AST: 468 U/L — ABNORMAL HIGH (ref 15–41)
Alkaline Phosphatase: 264 U/L — ABNORMAL HIGH (ref 38–126)
BILIRUBIN TOTAL: 4 mg/dL — AB (ref 0.3–1.2)
BUN: 8 mg/dL (ref 6–20)
CALCIUM: 8.5 mg/dL — AB (ref 8.9–10.3)
CO2: 21 mmol/L — AB (ref 22–32)
CREATININE: 0.41 mg/dL — AB (ref 0.44–1.00)
Chloride: 110 mmol/L (ref 98–111)
GFR calc Af Amer: >60 mL/min (ref >60)
GLUCOSE: 86 mg/dL (ref 70–99)
POTASSIUM: 3.3 mmol/L — AB (ref 3.5–5.1)
Sodium: 140 mmol/L (ref 135–145)
Total Protein: 6.3 g/dL — ABNORMAL LOW (ref 6.5–8.1)

## 2018-04-13 LAB — CBC WITH DIFFERENTIAL/PLATELET
BASOS PCT: 0 %
Basophils Absolute: 0 10*3/uL (ref 0.0–0.1)
EOS ABS: 0 10*3/uL (ref 0.0–0.7)
EOS PCT: 0 %
HCT: 26.1 % — ABNORMAL LOW (ref 36.0–46.0)
Hemoglobin: 7.4 g/dL — ABNORMAL LOW (ref 12.0–15.0)
LYMPHS ABS: 1.7 10*3/uL (ref 0.7–4.0)
Lymphocytes Relative: 17 %
MCH: 17.3 pg — AB (ref 26.0–34.0)
MCHC: 28.4 g/dL — AB (ref 30.0–36.0)
MCV: 61.1 fL — AB (ref 78.0–100.0)
MONO ABS: 1.3 10*3/uL — AB (ref 0.1–1.0)
MONOS PCT: 13 %
Neutro Abs: 6.9 10*3/uL (ref 1.7–7.7)
Neutrophils Relative %: 70 %
PLATELETS: 325 10*3/uL (ref 150–400)
RBC: 4.27 MIL/uL (ref 3.87–5.11)
RDW: 19.5 % — ABNORMAL HIGH (ref 11.5–15.5)
WBC: 9.9 10*3/uL (ref 4.0–10.5)

## 2018-04-13 LAB — VITAMIN B12: Vitamin B-12: 714 pg/mL (ref 180–914)

## 2018-04-13 LAB — RETICULOCYTES
RBC.: 4.27 MIL/uL (ref 3.87–5.11)
RETIC COUNT ABSOLUTE: 55.5 10*3/uL (ref 19.0–186.0)
RETIC CT PCT: 1.3 % (ref 0.4–3.1)

## 2018-04-13 LAB — FERRITIN: FERRITIN: 6 ng/mL — AB (ref 11–307)

## 2018-04-13 LAB — HIV ANTIBODY (ROUTINE TESTING W REFLEX): HIV Screen 4th Generation wRfx: NONREACTIVE

## 2018-04-13 LAB — FOLATE: Folate: 20.3 ng/mL (ref >5.9)

## 2018-04-13 LAB — ABO/RH: ABO/RH(D): O POS

## 2018-04-13 LAB — MAGNESIUM: Magnesium: 1.9 mg/dL (ref 1.7–2.4)

## 2018-04-13 MED ORDER — GADOBENATE DIMEGLUMINE 529 MG/ML IV SOLN
15.0000 mL | Freq: Once | INTRAVENOUS | Status: AC | PRN
  Administered 2018-04-13: 15 mL via INTRAVENOUS

## 2018-04-13 NOTE — Consult Note (Signed)
Consult received from Dr. Edward JollySilva this evening, patient with cholecystitis and elevated LFTs/ fever. MRCP negative for choledocholithiasis but limited by motion artifact.  Full consult to follow in AM. Please keep NPO at midnight and recheck LFT in AM. If Tbili continues to trend up recommend GI for ERCP; if Tbili trends down and remaining LFTs continue to improve will plan lap chole tentatively for 8/22

## 2018-04-13 NOTE — Progress Notes (Signed)
PROGRESS NOTE [facility]   Sydney Sandoval   ZOX:[id number] DOB: [birth year]  DOA: 04/12/2018 PCP: System, Pcp Not In   Brief Narrative:  Sydney Sandoval is a 47 year old female with medical history for arthritis who presented to the emergency department complaining of epigastric and right upper quadrant abdominal pain associated with nausea and vomiting.  Upon ED evaluation patient was found to have elevated liver enzymes with T bili of 2.6.  Elevated WBC 13 K, hemoglobin of 8.3 and mild hypokalemia.  GI was consulted and recommended MRCP.  Patient was admitted with working diagnosis of cholelithiasis.  Subjective: Patient seen and examined, abdominal pain has improved with pain medication.  MRCP negative for bile duct dilation.  Assessment & Plan: Acute cholecystitis Upon admission patient febrile, right upper quadrant abdominal pain with elevated WBC, elevated liver enzymes/jaundice and gallbladder thickening.  MRCP shows cholelithiasis with gallbladder thickening, CBD not dilated, similar findings to RUQ [place] On empiric antibiotic, will continue for now.  Surgery consult.  Continue IV hydration and analgesia. LFT trended down today, however T bili is up to 4.0. Check liver function in a.m.   Hypokalemia Repleted Check BMP and mag in a.m.  Iron deficiency anemia No signs of overt bleeding, patient report heavy periods  Will start iron supplement upon discharge   DVT prophylaxis: SCD's  Code Status: Full Code Family Communication: Family at bedside  Disposition Plan: Home pending improvement  Consultants:   Gen surgery   Procedures:   None   Antimicrobials: Anti-infectives (From admission, onward)   Start     Dose/Rate Route Frequency Ordered Stop   04/13/18 1800  cefTRIAXone (ROCEPHIN) 2 g in sodium chloride 0.9 % 100 mL IVPB     2 g 200 mL/hr over 30 Minutes Intravenous Every 24 hours 04/12/18 2212     04/12/18 2230  metroNIDAZOLE (FLAGYL) IVPB 500 mg     500  mg 100 mL/hr over 60 Minutes Intravenous Every 8 hours 04/12/18 2212     04/12/18 1915  cefTRIAXone (ROCEPHIN) 2 g in sodium chloride 0.9 % 100 mL IVPB     2 g 200 mL/hr over 30 Minutes Intravenous  Once 04/12/18 1912 04/12/18 2044          Objective: Vitals:   04/12/18 2240 04/13/18 0147 04/13/18 0550 04/13/18 1118  BP: (!) 104/53  (!) 104/59   Pulse: 88  81   Resp: 14  16   Temp: (!) 102.8 F (39.3 C) 100.3 F (37.9 C) 99.5 F (37.5 C)   TempSrc: Oral Oral Oral   SpO2: 98%  95% 93%  Weight:   71.3 kg   Height:        Intake/Output Summary (Last 24 hours) at 04/13/2018 1649 Last data filed at 04/13/2018 1359 Gross per 24 hour  Intake 2409.1 ml  Output 2000 ml  Net 409.1 ml   Filed Weights   04/12/18 1606 04/13/18 0550  Weight: 66.2 kg 71.3 kg    Examination:  General exam: Appears calm and comfortable, jaundice   HEENT: AC/AT, PERRLA, + scleral icterus  Respiratory system: Clear to auscultation. No wheezes,crackle or rhonchi Cardiovascular system: S1 & S2 heard, RRR. No JVD, murmurs, rubs or gallops Gastrointestinal system: Abdomen is nondistended, soft and nontender. Normal bowel sounds heard. Central nervous system: Alert and oriented. No focal neurological deficits. Extremities: No pedal edema.  Skin: No rashes, lesions or ulcers Psychiatry:  Mood & affect appropriate.   Data Reviewed: I have personally reviewed following labs and imaging  studies  CBC: Recent Labs  Lab 04/12/18 1700 04/13/18 0545  WBC 13.1* 9.9  NEUTROABS 11.0* 6.9  HGB 8.3* 7.4*  HCT 28.6* 26.1*  MCV 59.1* 61.1*  PLT 415* 325   Basic Metabolic Panel: Recent Labs  Lab 04/12/18 1700 04/13/18 0545  NA 142 140  K 3.4* 3.3*  CL 107 110  CO2 25 21*  GLUCOSE 98 86  BUN 10 8  CREATININE 0.38* 0.41*  CALCIUM 9.2 8.5*  MG  --  1.9   GFR: Estimated Creatinine Clearance: 78.2 mL/min (A) (by C-G formula based on SCr of 0.41 mg/dL (L)). Liver Function Tests: Recent Labs  Lab  04/12/18 1700 04/13/18 0545  AST 834* 468*  ALT 882* 624*  ALKPHOS 325* 264*  BILITOT 2.6* 4.0*  PROT 8.0 6.3*  ALBUMIN 4.3 3.3*   Recent Labs  Lab 04/12/18 1700  LIPASE 133*   No results for input(s): AMMONIA in the last 168 hours. Coagulation Profile: No results for input(s): INR, PROTIME in the last 168 hours. Cardiac Enzymes: No results for input(s): CKTOTAL, CKMB, CKMBINDEX, TROPONINI in the last 168 hours. BNP (last 3 results) No results for input(s): PROBNP in the last 8760 hours. HbA1C: No results for input(s): HGBA1C in the last 72 hours. CBG: No results for input(s): GLUCAP in the last 168 hours. Lipid Profile: No results for input(s): CHOL, HDL, LDLCALC, TRIG, CHOLHDL, LDLDIRECT in the last 72 hours. Thyroid Function Tests: No results for input(s): TSH, T4TOTAL, FREET4, T3FREE, THYROIDAB in the last 72 hours. Anemia Panel: Recent Labs    04/13/18 0545  VITAMINB12 714  FOLATE 20.3  FERRITIN 6*  TIBC 415  IRON 21*  RETICCTPCT 1.3   Sepsis Labs: No results for input(s): PROCALCITON, LATICACIDVEN in the last 168 hours.  Recent Results (from the past 240 hour(s))  Culture, blood (routine x 2)     Status: None (Preliminary result)   Collection Time: 04/12/18 11:20 PM  Result Value Ref Range Status   Specimen Description   Final    BLOOD LEFT WRIST Performed at [facility], [address], [place], [place] [address]    Special Requests   Final    AEROBIC BOTTLE ONLY Blood Culture adequate volume Performed at [facility], [address], [place], [place] [address]    Culture PENDING  Incomplete   Report Status PENDING  Incomplete      Radiology Studies: Mr 3d Recon At Scanner  Result Date: 04/13/2018 CLINICAL DATA:  Epigastric and right upper quadrant pain with nausea and non bloody vomiting. EXAM: MRI ABDOMEN WITHOUT AND WITH CONTRAST (INCLUDING MRCP) TECHNIQUE: Multiplanar multisequence MR imaging of the abdomen was  performed both before and after the administration of intravenous contrast. Heavily T2-weighted images of the biliary and pancreatic ducts were obtained, and three-dimensional MRCP images were rendered by post processing. CONTRAST:  15mL MULTIHANCE GADOBENATE DIMEGLUMINE 529 MG/ML IV SOLN COMPARISON:  Ultrasound 04/12/2018 FINDINGS: Lower chest: Subsegmental atelectasis noted within the lung bases with overlying posterior pleural thickening. Hepatobiliary: Mild hepatic steatosis. There is significant motion artifact which diminishes exam detail within the liver. No focal liver abnormality identified. Gallbladder distension noted. Multiple tiny stones are identified layering within the gallbladder measuring around 3 mm. The gallbladder wall appears mildly prominent as noted on ultrasound from 04/12/2018. No intrahepatic biliary ductal dilatation. The common bile duct has a normal caliber measuring 6 mm. No choledocholithiasis. Pancreas: Within the limitations of motion artifact the pancreas appears unremarkable. No inflammation or  main duct dilatation. Spleen:  Spleen is unremarkable. Adrenals/Urinary Tract: Normal appearance of the adrenal glands. No kidney mass or hydronephrosis identified. Stomach/Bowel: Visualized portions within the abdomen are unremarkable. Vascular/Lymphatic: No pathologically enlarged lymph nodes identified. No abdominal aortic aneurysm demonstrated. Other:  None. Musculoskeletal: No suspicious bone lesions identified. IMPRESSION: 1. Exam detail diminished secondary to motion artifact. 2. Gallstones and mild gallbladder wall prominence as demonstrated on recent ultrasound from 04/12/2018. No significant biliary ductal dilatation or evidence of choledocholithiasis. 3. Hepatic steatosis. Electronically Signed   By: Signa Kellaylor  Stroud M.D.   On: 04/13/2018 16:07   Mr Abdomen Mrcp Vivien RossettiW Wo Contast  Result Date: 04/13/2018 CLINICAL DATA:  Epigastric and right upper quadrant pain with nausea and non  bloody vomiting. EXAM: MRI ABDOMEN WITHOUT AND WITH CONTRAST (INCLUDING MRCP) TECHNIQUE: Multiplanar multisequence MR imaging of the abdomen was performed both before and after the administration of intravenous contrast. Heavily T2-weighted images of the biliary and pancreatic ducts were obtained, and three-dimensional MRCP images were rendered by post processing. CONTRAST:  15mL MULTIHANCE GADOBENATE DIMEGLUMINE 529 MG/ML IV SOLN COMPARISON:  Ultrasound 04/12/2018 FINDINGS: Lower chest: Subsegmental atelectasis noted within the lung bases with overlying posterior pleural thickening. Hepatobiliary: Mild hepatic steatosis. There is significant motion artifact which diminishes exam detail within the liver. No focal liver abnormality identified. Gallbladder distension noted. Multiple tiny stones are identified layering within the gallbladder measuring around 3 mm. The gallbladder wall appears mildly prominent as noted on ultrasound from 04/12/2018. No intrahepatic biliary ductal dilatation. The common bile duct has a normal caliber measuring 6 mm. No choledocholithiasis. Pancreas: Within the limitations of motion artifact the pancreas appears unremarkable. No inflammation or main duct dilatation. Spleen:  Spleen is unremarkable. Adrenals/Urinary Tract: Normal appearance of the adrenal glands. No kidney mass or hydronephrosis identified. Stomach/Bowel: Visualized portions within the abdomen are unremarkable. Vascular/Lymphatic: No pathologically enlarged lymph nodes identified. No abdominal aortic aneurysm demonstrated. Other:  None. Musculoskeletal: No suspicious bone lesions identified. IMPRESSION: 1. Exam detail diminished secondary to motion artifact. 2. Gallstones and mild gallbladder wall prominence as demonstrated on recent ultrasound from 04/12/2018. No significant biliary ductal dilatation or evidence of choledocholithiasis. 3. Hepatic steatosis. Electronically Signed   By: Signa Kellaylor  Stroud M.D.   On: 04/13/2018  16:07   [place] Abdomen Limited Ruq  Result Date: 04/12/2018 CLINICAL DATA:  RIGHT UPPER QUADRANT pain, nausea, and vomiting for 2 days. EXAM: ULTRASOUND ABDOMEN LIMITED RIGHT UPPER QUADRANT COMPARISON:  None available FINDINGS: Gallbladder: Gallbladder is distended and contains numerous small stones, measuring on the order of 3 millimeters in diameter. Gallbladder wall is mildly thickened, 4.1 millimeters. No sonographic Murphy sign. No pericholecystic fluid. Common bile duct: Diameter: 1.6 millimeters Liver: No focal lesion identified. Within normal limits in parenchymal echogenicity. Portal vein is patent on color Doppler imaging with normal direction of blood flow towards the liver. IMPRESSION: 1. Cholelithiasis and mild gallbladder wall thickening. 2. Negative sonographic Murphy's sign. Electronically Signed   By: Norva PavlovElizabeth  Brown M.D.   On: 04/12/2018 18:01      Scheduled Meds: . ondansetron (ZOFRAN) IV  4 mg Intravenous Once   Continuous Infusions: . sodium chloride 10 mL/hr at 04/13/18 1000  . cefTRIAXone (ROCEPHIN)  IV    . famotidine (PEPCID) IV 100 mL/hr at 04/13/18 1000  . metronidazole Stopped (04/13/18 0624)     LOS: 1 day    Time spent: Total of 25 minutes spent with pt, greater than 50% of which was spent in discussion of  treatment, counseling and  coordination of care   Latrelle DodrillEdwin Silva, MD Pager: Text Page via [email or web address]   If 7PM-7AM, please contact night-coverage [email or web address] 04/13/2018, 4:49 PM   Note - This record has been created using [employer] software. Chart creation errors have been sought, but may not always have been located. Such creation errors do not reflect on the standard of medical care.

## 2018-04-14 ENCOUNTER — Inpatient Hospital Stay (HOSPITAL_COMMUNITY): Payer: Self-pay | Admitting: Anesthesiology

## 2018-04-14 ENCOUNTER — Encounter (HOSPITAL_COMMUNITY)
Admission: EM | Disposition: A | Discharge status: Home / Self Care | Payer: Self-pay | Source: Home / Self Care | Attending: Family Medicine

## 2018-04-14 ENCOUNTER — Inpatient Hospital Stay (HOSPITAL_COMMUNITY): Payer: Self-pay

## 2018-04-14 ENCOUNTER — Encounter (HOSPITAL_COMMUNITY): Payer: Self-pay | Admitting: Anesthesiology

## 2018-04-14 HISTORY — PX: CHOLECYSTECTOMY: SHX55

## 2018-04-14 LAB — COMPREHENSIVE METABOLIC PANEL
ALT: 510 U/L — AB (ref 0–44)
AST: 259 U/L — AB (ref 15–41)
Albumin: 3.7 g/dL (ref 3.5–5.0)
Alkaline Phosphatase: 256 U/L — ABNORMAL HIGH (ref 38–126)
Anion gap: 8 (ref 5–15)
BILIRUBIN TOTAL: 2.9 mg/dL — AB (ref 0.3–1.2)
CALCIUM: 9.2 mg/dL (ref 8.9–10.3)
CO2: 22 mmol/L (ref 22–32)
CREATININE: 0.38 mg/dL — AB (ref 0.44–1.00)
Chloride: 108 mmol/L (ref 98–111)
GFR calc Af Amer: >60 mL/min (ref >60)
Glucose, Bld: 105 mg/dL — ABNORMAL HIGH (ref 70–99)
Potassium: 3.3 mmol/L — ABNORMAL LOW (ref 3.5–5.1)
Sodium: 138 mmol/L (ref 135–145)
TOTAL PROTEIN: 6.9 g/dL (ref 6.5–8.1)

## 2018-04-14 LAB — CBC WITH DIFFERENTIAL/PLATELET
Basophils Absolute: 0 10*3/uL (ref 0.0–0.1)
Basophils Relative: 0 %
Eosinophils Absolute: 0.1 10*3/uL (ref 0.0–0.7)
Eosinophils Relative: 1 %
HCT: 26.9 % — ABNORMAL LOW (ref 36.0–46.0)
Hemoglobin: 7.7 g/dL — ABNORMAL LOW (ref 12.0–15.0)
Lymphocytes Relative: 25 %
Lymphs Abs: 1.2 10*3/uL (ref 0.7–4.0)
MCH: 17.3 pg — ABNORMAL LOW (ref 26.0–34.0)
MCHC: 28.6 g/dL — ABNORMAL LOW (ref 30.0–36.0)
MCV: 60.3 fL — ABNORMAL LOW (ref 78.0–100.0)
Monocytes Absolute: 0.6 10*3/uL (ref 0.1–1.0)
Monocytes Relative: 12 %
Neutro Abs: 3 10*3/uL (ref 1.7–7.7)
Neutrophils Relative %: 62 %
Platelets: 341 10*3/uL (ref 150–400)
RBC: 4.46 MIL/uL (ref 3.87–5.11)
RDW: 20 % — ABNORMAL HIGH (ref 11.5–15.5)
WBC: 4.8 10*3/uL (ref 4.0–10.5)

## 2018-04-14 LAB — SURGICAL PCR SCREEN
MRSA, PCR: NEGATIVE
Staphylococcus aureus: NEGATIVE

## 2018-04-14 SURGERY — LAPAROSCOPIC CHOLECYSTECTOMY WITH INTRAOPERATIVE CHOLANGIOGRAM
Anesthesia: General

## 2018-04-14 MED ORDER — DEXMEDETOMIDINE HCL IN NACL 200 MCG/50ML IV SOLN
INTRAVENOUS | Status: DC | PRN
  Administered 2018-04-14: 4 ug via INTRAVENOUS
  Administered 2018-04-14: 8 ug via INTRAVENOUS
  Administered 2018-04-14: 4 ug via INTRAVENOUS

## 2018-04-14 MED ORDER — HYDROMORPHONE HCL 1 MG/ML IJ SOLN: 0.2500 mg | INTRAMUSCULAR | Status: DC | PRN

## 2018-04-14 MED ORDER — EPHEDRINE SULFATE-NACL 50-0.9 MG/10ML-% IV SOSY
PREFILLED_SYRINGE | INTRAVENOUS | Status: DC | PRN
  Administered 2018-04-14: 5 mg via INTRAVENOUS

## 2018-04-14 MED ORDER — MEPERIDINE HCL 50 MG/ML IJ SOLN: 6.2500 mg | INTRAMUSCULAR | Status: DC | PRN

## 2018-04-14 MED ORDER — PROPOFOL 10 MG/ML IV BOLUS
INTRAVENOUS | Status: DC | PRN
  Administered 2018-04-14: 150 mg via INTRAVENOUS

## 2018-04-14 MED ORDER — IOPAMIDOL (ISOVUE-300) INJECTION 61%
INTRAVENOUS | Status: AC
  Filled 2018-04-14: qty 50

## 2018-04-14 MED ORDER — IBUPROFEN 800 MG PO TABS
800.0000 mg | ORAL_TABLET | Freq: Three times a day (TID) | ORAL | Status: DC
  Administered 2018-04-14 – 2018-04-15 (×4): 800 mg via ORAL
  Filled 2018-04-14 (×5): qty 1

## 2018-04-14 MED ORDER — GABAPENTIN 300 MG PO CAPS
300.0000 mg | ORAL_CAPSULE | Freq: Three times a day (TID) | ORAL | Status: DC
  Administered 2018-04-14 – 2018-04-15 (×3): 300 mg via ORAL
  Filled 2018-04-14 (×5): qty 1

## 2018-04-14 MED ORDER — FENTANYL CITRATE (PF) 100 MCG/2ML IJ SOLN
INTRAMUSCULAR | Status: AC
  Filled 2018-04-14: qty 2

## 2018-04-14 MED ORDER — OXYCODONE HCL 5 MG PO TABS: 5.0000 mg | ORAL_TABLET | ORAL | Status: DC | PRN

## 2018-04-14 MED ORDER — DEXAMETHASONE SODIUM PHOSPHATE 10 MG/ML IJ SOLN
INTRAMUSCULAR | Status: DC | PRN
  Administered 2018-04-14: 10 mg via INTRAVENOUS

## 2018-04-14 MED ORDER — DIPHENHYDRAMINE HCL 25 MG PO CAPS: 25.0000 mg | ORAL_CAPSULE | Freq: Three times a day (TID) | ORAL | Status: DC | PRN

## 2018-04-14 MED ORDER — BUPIVACAINE-EPINEPHRINE 0.25% -1:200000 IJ SOLN
INTRAMUSCULAR | Status: DC | PRN
  Administered 2018-04-14: 30 mL

## 2018-04-14 MED ORDER — PROMETHAZINE HCL 25 MG/ML IJ SOLN: 6.2500 mg | INTRAMUSCULAR | Status: DC | PRN

## 2018-04-14 MED ORDER — ROCURONIUM BROMIDE 10 MG/ML (PF) SYRINGE
PREFILLED_SYRINGE | INTRAVENOUS | Status: DC | PRN
  Administered 2018-04-14: 40 mg via INTRAVENOUS

## 2018-04-14 MED ORDER — ACETAMINOPHEN 500 MG PO TABS: 1000.0000 mg | ORAL_TABLET | Freq: Once | ORAL | Status: DC

## 2018-04-14 MED ORDER — HYDROMORPHONE HCL 1 MG/ML IJ SOLN: 1.0000 mg | INTRAMUSCULAR | Status: DC | PRN

## 2018-04-14 MED ORDER — EPHEDRINE 5 MG/ML INJ
INTRAVENOUS | Status: AC
  Filled 2018-04-14: qty 10

## 2018-04-14 MED ORDER — PROPOFOL 10 MG/ML IV BOLUS
INTRAVENOUS | Status: AC
  Filled 2018-04-14: qty 20

## 2018-04-14 MED ORDER — FENTANYL CITRATE (PF) 100 MCG/2ML IJ SOLN
INTRAMUSCULAR | Status: DC | PRN
  Administered 2018-04-14: 50 ug via INTRAVENOUS
  Administered 2018-04-14: 25 ug via INTRAVENOUS
  Administered 2018-04-14: 100 ug via INTRAVENOUS
  Administered 2018-04-14: 25 ug via INTRAVENOUS

## 2018-04-14 MED ORDER — ACETAMINOPHEN 10 MG/ML IV SOLN
INTRAVENOUS | Status: AC
  Administered 2018-04-14: 1000 mg via INTRAVENOUS
  Filled 2018-04-14: qty 100

## 2018-04-14 MED ORDER — HYDROMORPHONE HCL 1 MG/ML IJ SOLN
INTRAMUSCULAR | Status: AC
  Filled 2018-04-14: qty 1

## 2018-04-14 MED ORDER — METHOCARBAMOL 1000 MG/10ML IJ SOLN
500.0000 mg | Freq: Four times a day (QID) | INTRAVENOUS | Status: DC | PRN
  Filled 2018-04-14: qty 5

## 2018-04-14 MED ORDER — ACETAMINOPHEN 500 MG PO TABS
1000.0000 mg | ORAL_TABLET | Freq: Three times a day (TID) | ORAL | Status: DC
  Administered 2018-04-14 – 2018-04-15 (×3): 1000 mg via ORAL
  Filled 2018-04-14 (×4): qty 2

## 2018-04-14 MED ORDER — ONDANSETRON HCL 4 MG/2ML IJ SOLN
INTRAMUSCULAR | Status: DC | PRN
  Administered 2018-04-14: 4 mg via INTRAVENOUS

## 2018-04-14 MED ORDER — SUGAMMADEX SODIUM 200 MG/2ML IV SOLN
INTRAVENOUS | Status: DC | PRN
  Administered 2018-04-14: 200 mg via INTRAVENOUS

## 2018-04-14 MED ORDER — KCL IN DEXTROSE-NACL 20-5-0.45 MEQ/L-%-% IV SOLN
INTRAVENOUS | Status: DC
  Administered 2018-04-14 – 2018-04-15 (×3): via INTRAVENOUS
  Filled 2018-04-14 (×3): qty 1000

## 2018-04-14 MED ORDER — IOPAMIDOL (ISOVUE-300) INJECTION 61%
INTRAVENOUS | Status: DC | PRN
  Administered 2018-04-14: 5 mL

## 2018-04-14 MED ORDER — LACTATED RINGERS IR SOLN
Status: DC | PRN
  Administered 2018-04-14: 1000 mL

## 2018-04-14 MED ORDER — LIDOCAINE 2% (20 MG/ML) 5 ML SYRINGE
INTRAMUSCULAR | Status: DC | PRN
  Administered 2018-04-14: 80 mg via INTRAVENOUS

## 2018-04-14 MED ORDER — MIDAZOLAM HCL 2 MG/2ML IJ SOLN
INTRAMUSCULAR | Status: AC
  Filled 2018-04-14: qty 2

## 2018-04-14 MED ORDER — ROCURONIUM BROMIDE 10 MG/ML (PF) SYRINGE
PREFILLED_SYRINGE | INTRAVENOUS | Status: AC
  Filled 2018-04-14: qty 10

## 2018-04-14 MED ORDER — ACETAMINOPHEN 10 MG/ML IV SOLN
1000.0000 mg | Freq: Once | INTRAVENOUS | Status: DC | PRN
  Administered 2018-04-14: 1000 mg via INTRAVENOUS

## 2018-04-14 MED ORDER — LIDOCAINE 2% (20 MG/ML) 5 ML SYRINGE
INTRAMUSCULAR | Status: AC
  Filled 2018-04-14: qty 5

## 2018-04-14 MED ORDER — LIP MEDEX EX OINT
TOPICAL_OINTMENT | CUTANEOUS | Status: AC
  Administered 2018-04-14: 15:00:00
  Filled 2018-04-14: qty 7

## 2018-04-14 MED ORDER — DIPHENHYDRAMINE HCL 50 MG/ML IJ SOLN: 12.5000 mg | Freq: Three times a day (TID) | INTRAMUSCULAR | Status: DC | PRN

## 2018-04-14 MED ORDER — LACTATED RINGERS IV SOLN
INTRAVENOUS | Status: DC
  Administered 2018-04-14: 10:00:00 via INTRAVENOUS

## 2018-04-14 MED ORDER — MIDAZOLAM HCL 5 MG/5ML IJ SOLN
INTRAMUSCULAR | Status: DC | PRN
  Administered 2018-04-14: 2 mg via INTRAVENOUS

## 2018-04-14 MED ORDER — BUPIVACAINE-EPINEPHRINE (PF) 0.25% -1:200000 IJ SOLN
INTRAMUSCULAR | Status: AC
  Filled 2018-04-14: qty 30

## 2018-04-14 MED ORDER — 0.9 % SODIUM CHLORIDE (POUR BTL) OPTIME
TOPICAL | Status: DC | PRN
  Administered 2018-04-14: 1000 mL

## 2018-04-14 MED ORDER — HYDROCODONE-ACETAMINOPHEN 7.5-325 MG PO TABS: 1.0000 | ORAL_TABLET | Freq: Once | ORAL | Status: DC | PRN

## 2018-04-14 MED ORDER — GABAPENTIN 300 MG PO CAPS: 300.0000 mg | ORAL_CAPSULE | Freq: Once | ORAL | Status: DC

## 2018-04-14 SURGICAL SUPPLY — 42 items
APPLIER CLIP ROT 10 11.4 M/L (STAPLE) ×3
CABLE HIGH FREQUENCY MONO STRZ (ELECTRODE) ×3 IMPLANT
CHLORAPREP W/TINT 26ML (MISCELLANEOUS) ×3 IMPLANT
CLIP APPLIE ROT 10 11.4 M/L (STAPLE) ×1 IMPLANT
COVER MAYO STAND STRL (DRAPES) ×3 IMPLANT
COVER SURGICAL LIGHT HANDLE (MISCELLANEOUS) ×3 IMPLANT
DECANTER SPIKE VIAL GLASS SM (MISCELLANEOUS) ×3 IMPLANT
DERMABOND ADVANCED (GAUZE/BANDAGES/DRESSINGS) ×2
DERMABOND ADVANCED .7 DNX12 (GAUZE/BANDAGES/DRESSINGS) ×1 IMPLANT
DRAPE C-ARM 42X120 X-RAY (DRAPES) ×3 IMPLANT
ELECT REM PT RETURN 15FT ADLT (MISCELLANEOUS) ×3 IMPLANT
GLOVE BIO SURGEON STRL SZ 6 (GLOVE) ×3 IMPLANT
GLOVE BIO SURGEON STRL SZ7.5 (GLOVE) ×3 IMPLANT
GLOVE BIOGEL PI IND STRL 6.5 (GLOVE) ×1 IMPLANT
GLOVE BIOGEL PI IND STRL 7.0 (GLOVE) ×1 IMPLANT
GLOVE BIOGEL PI IND STRL 8 (GLOVE) ×1 IMPLANT
GLOVE BIOGEL PI INDICATOR 6.5 (GLOVE) ×2
GLOVE BIOGEL PI INDICATOR 7.0 (GLOVE) ×2
GLOVE BIOGEL PI INDICATOR 8 (GLOVE) ×2
GLOVE ECLIPSE 6.5 STRL STRAW (GLOVE) ×3 IMPLANT
GLOVE INDICATOR 6.5 STRL GRN (GLOVE) ×3 IMPLANT
GLOVE SURG SS PI 6.0 STRL IVOR (GLOVE) ×3 IMPLANT
GOWN STRL REUS W/ TWL XL LVL3 (GOWN DISPOSABLE) ×1 IMPLANT
GOWN STRL REUS W/TWL LRG LVL3 (GOWN DISPOSABLE) ×9 IMPLANT
GOWN STRL REUS W/TWL XL LVL3 (GOWN DISPOSABLE) ×8 IMPLANT
GRASPER SUT TROCAR 14GX15 (MISCELLANEOUS) ×3 IMPLANT
HEMOSTAT SNOW SURGICEL 2X4 (HEMOSTASIS) IMPLANT
KIT BASIN OR (CUSTOM PROCEDURE TRAY) ×3 IMPLANT
NEEDLE INSUFFLATION 14GA 120MM (NEEDLE) ×3 IMPLANT
POUCH SPECIMEN RETRIEVAL 10MM (ENDOMECHANICALS) ×3 IMPLANT
SCISSORS LAP 5X35 DISP (ENDOMECHANICALS) ×3 IMPLANT
SET CHOLANGIOGRAPH MIX (MISCELLANEOUS) ×3 IMPLANT
SET IRRIG TUBING LAPAROSCOPIC (IRRIGATION / IRRIGATOR) ×3 IMPLANT
SLEEVE XCEL OPT CAN 5 100 (ENDOMECHANICALS) ×6 IMPLANT
SUT MNCRL AB 4-0 PS2 18 (SUTURE) ×6 IMPLANT
SYR 10ML ECCENTRIC (SYRINGE) ×3 IMPLANT
TOWEL OR 17X26 10 PK STRL BLUE (TOWEL DISPOSABLE) ×3 IMPLANT
TOWEL OR NON WOVEN STRL DISP B (DISPOSABLE) ×3 IMPLANT
TRAY LAPAROSCOPIC (CUSTOM PROCEDURE TRAY) ×3 IMPLANT
TROCAR BLADELESS OPT 5 100 (ENDOMECHANICALS) ×3 IMPLANT
TROCAR XCEL 12X100 BLDLESS (ENDOMECHANICALS) ×3 IMPLANT
TUBING INSUF HEATED (TUBING) ×3 IMPLANT

## 2018-04-14 NOTE — Progress Notes (Signed)
PROGRESS NOTE [facility]   Sydney Sandoval   ZOX:[id number] DOB: [birth year]  DOA: 04/12/2018 PCP: System, Pcp Not In   Brief Narrative:  Sydney Sandoval is a 47 year old female with medical history for arthritis who presented to the emergency department complaining of epigastric and right upper quadrant abdominal pain associated with nausea and vomiting.  Upon ED evaluation patient was found to have elevated liver enzymes with T bili of 2.6.  Elevated WBC 13 K, hemoglobin of 8.3 and mild hypokalemia.  GI was consulted and recommended MRCP.  Patient was admitted with working diagnosis of cholelithiasis.  Subjective: Patient seen and examined, she is status post Lap chole. Tolerated well procedure. C/o abdominal pain. No other concerns   Assessment & Plan: Acute cholecystitis Upon admission patient febrile, right upper quadrant abdominal pain with elevated WBC, elevated liver enzymes/jaundice and gallbladder thickening. MRCP shows cholelithiasis with gallbladder thickening, CBD not dilated, similar findings to RUQ [place]. Patient underwent Lap chole with cholangiogram today. Continue empiric abx for 24 hrs. LFT's trending down. Check liver function in AM, likely they will be up from procedure. Continue management per general surgery.    Hypokalemia On IVF with KCL  Check BMP and mag in AM   Iron deficiency anemia No signs of overt bleeding, patient report heavy periods  Will start iron supplement upon discharge   DVT prophylaxis: SCD's  Code Status: Full Code Family Communication: Family at bedside  Disposition Plan: Home pending improvement  Consultants:   Gen surgery   Procedures:   None   Antimicrobials: Anti-infectives (From admission, onward)   Start     Dose/Rate Route Frequency Ordered Stop   04/13/18 1800  cefTRIAXone (ROCEPHIN) 2 g in sodium chloride 0.9 % 100 mL IVPB     2 g 200 mL/hr over 30 Minutes Intravenous Every 24 hours 04/12/18 2212     04/12/18 2230   metroNIDAZOLE (FLAGYL) IVPB 500 mg     500 mg 100 mL/hr over 60 Minutes Intravenous Every 8 hours 04/12/18 2212     04/12/18 1915  cefTRIAXone (ROCEPHIN) 2 g in sodium chloride 0.9 % 100 mL IVPB     2 g 200 mL/hr over 30 Minutes Intravenous  Once 04/12/18 1912 04/12/18 2044         Objective: Vitals:   04/14/18 1245 04/14/18 1259 04/14/18 1316 04/14/18 1416  BP: 113/61  106/60 110/63  Pulse: 68  69 61  Resp: (!) 24 20 20 18   Temp: 99.2 F (37.3 C)  98.9 F (37.2 C) 98.8 F (37.1 C)  TempSrc:   Oral Oral  SpO2: 95%  95% 96%  Weight:      Height:        Intake/Output Summary (Last 24 hours) at 04/14/2018 1541 Last data filed at 04/14/2018 1242 Gross per 24 hour  Intake 1480 ml  Output 525 ml  Net 955 ml   Filed Weights   04/12/18 1606 04/13/18 0550  Weight: 66.2 kg 71.3 kg    Examination:  General: NAD Cardiovascular: RRR, S1/S2 +, no rubs, no gallops Respiratory: CTA bilaterally, no wheezing, no rhonchi Abdominal: Soft mild distended, mild tenderness. No BS  Extremities: no edema  Data Reviewed: I have personally reviewed following labs and imaging studies  CBC: Recent Labs  Lab 04/12/18 1700 04/13/18 0545 04/14/18 0456  WBC 13.1* 9.9 4.8  NEUTROABS 11.0* 6.9 3.0  HGB 8.3* 7.4* 7.7*  HCT 28.6* 26.1* 26.9*  MCV 59.1* 61.1* 60.3*  PLT 415* 325 341  Basic Metabolic Panel: Recent Labs  Lab 04/12/18 1700 04/13/18 0545 04/14/18 0456  NA 142 140 138  K 3.4* 3.3* 3.3*  CL 107 110 108  CO2 25 21* 22  GLUCOSE 98 86 105*  BUN 10 8 <5*  CREATININE 0.38* 0.41* 0.38*  CALCIUM 9.2 8.5* 9.2  MG  --  1.9  --    GFR: Estimated Creatinine Clearance: 78.2 mL/min (A) (by C-G formula based on SCr of 0.38 mg/dL (L)). Liver Function Tests: Recent Labs  Lab 04/12/18 1700 04/13/18 0545 04/14/18 0456  AST 834* 468* 259*  ALT 882* 624* 510*  ALKPHOS 325* 264* 256*  BILITOT 2.6* 4.0* 2.9*  PROT 8.0 6.3* 6.9  ALBUMIN 4.3 3.3* 3.7   Recent Labs  Lab  04/12/18 1700  LIPASE 133*   No results for input(s): AMMONIA in the last 168 hours. Coagulation Profile: No results for input(s): INR, PROTIME in the last 168 hours. Cardiac Enzymes: No results for input(s): CKTOTAL, CKMB, CKMBINDEX, TROPONINI in the last 168 hours. BNP (last 3 results) No results for input(s): PROBNP in the last 8760 hours. HbA1C: No results for input(s): HGBA1C in the last 72 hours. CBG: No results for input(s): GLUCAP in the last 168 hours. Lipid Profile: No results for input(s): CHOL, HDL, LDLCALC, TRIG, CHOLHDL, LDLDIRECT in the last 72 hours. Thyroid Function Tests: No results for input(s): TSH, T4TOTAL, FREET4, T3FREE, THYROIDAB in the last 72 hours. Anemia Panel: Recent Labs    04/13/18 0545  VITAMINB12 714  FOLATE 20.3  FERRITIN 6*  TIBC 415  IRON 21*  RETICCTPCT 1.3   Sepsis Labs: No results for input(s): PROCALCITON, LATICACIDVEN in the last 168 hours.  Recent Results (from the past 240 hour(s))  Culture, blood (routine x 2)     Status: None (Preliminary result)   Collection Time: 04/12/18 11:14 PM  Result Value Ref Range Status   Specimen Description   Final    BLOOD LEFT ANTECUBITAL Performed at [facility], [address], [place], [place] [address]    Special Requests   Final    BOTTLES DRAWN AEROBIC AND ANAEROBIC Blood Culture adequate volume Performed at [facility], [address], [place], [place] [address]    Culture   Final    NO GROWTH 1 DAY Performed at [facility], [address] [place], [place] [address]    Report Status PENDING  Incomplete  Culture, blood (routine x 2)     Status: None (Preliminary result)   Collection Time: 04/12/18 11:20 PM  Result Value Ref Range Status   Specimen Description   Final    BLOOD LEFT WRIST Performed at [facility], [address], [place], [place] [address]    Special Requests   Final    AEROBIC BOTTLE ONLY Blood Culture  adequate volume Performed at [facility], [address] [place], [place] [address]    Culture   Final    NO GROWTH 1 DAY Performed at [facility], [address], [place], [place] [address]    Report Status PENDING  Incomplete  Surgical pcr screen     Status: None   Collection Time: 04/14/18  9:14 AM  Result Value Ref Range Status   MRSA, PCR NEGATIVE NEGATIVE Final   Staphylococcus aureus NEGATIVE NEGATIVE Final    Comment: (NOTE) The Xpert SA Assay (FDA approved for NASAL specimens in patients 47 years of age and older), is one component  of a comprehensive surveillance program. It is not intended to diagnose infection nor to guide or monitor treatment. Performed at [facility], [address] [place], [place] [id number]       Radiology Studies: Mr 3d Recon At Scanner  Result Date: 04/13/2018 CLINICAL DATA:  Epigastric and right upper quadrant pain with nausea and non bloody vomiting. EXAM: MRI ABDOMEN WITHOUT AND WITH CONTRAST (INCLUDING MRCP) TECHNIQUE: Multiplanar multisequence MR imaging of the abdomen was performed both before and after the administration of intravenous contrast. Heavily T2-weighted images of the biliary and pancreatic ducts were obtained, and three-dimensional MRCP images were rendered by post processing. CONTRAST:  15mL MULTIHANCE GADOBENATE DIMEGLUMINE 529 MG/ML IV SOLN COMPARISON:  Ultrasound 04/12/2018 FINDINGS: Lower chest: Subsegmental atelectasis noted within the lung bases with overlying posterior pleural thickening. Hepatobiliary: Mild hepatic steatosis. There is significant motion artifact which diminishes exam detail within the liver. No focal liver abnormality identified. Gallbladder distension noted. Multiple tiny stones are identified layering within the gallbladder measuring around 3 mm. The gallbladder wall appears mildly prominent as noted on ultrasound from 04/12/2018. No intrahepatic biliary ductal  dilatation. The common bile duct has a normal caliber measuring 6 mm. No choledocholithiasis. Pancreas: Within the limitations of motion artifact the pancreas appears unremarkable. No inflammation or main duct dilatation. Spleen:  Spleen is unremarkable. Adrenals/Urinary Tract: Normal appearance of the adrenal glands. No kidney mass or hydronephrosis identified. Stomach/Bowel: Visualized portions within the abdomen are unremarkable. Vascular/Lymphatic: No pathologically enlarged lymph nodes identified. No abdominal aortic aneurysm demonstrated. Other:  None. Musculoskeletal: No suspicious bone lesions identified. IMPRESSION: 1. Exam detail diminished secondary to motion artifact. 2. Gallstones and mild gallbladder wall prominence as demonstrated on recent ultrasound from 04/12/2018. No significant biliary ductal dilatation or evidence of choledocholithiasis. 3. Hepatic steatosis. Electronically Signed   By: Signa Kellaylor  Stroud M.D.   On: 04/13/2018 16:07   Mr Abdomen Mrcp Vivien RossettiW Wo Contast  Result Date: 04/13/2018 CLINICAL DATA:  Epigastric and right upper quadrant pain with nausea and non bloody vomiting. EXAM: MRI ABDOMEN WITHOUT AND WITH CONTRAST (INCLUDING MRCP) TECHNIQUE: Multiplanar multisequence MR imaging of the abdomen was performed both before and after the administration of intravenous contrast. Heavily T2-weighted images of the biliary and pancreatic ducts were obtained, and three-dimensional MRCP images were rendered by post processing. CONTRAST:  15mL MULTIHANCE GADOBENATE DIMEGLUMINE 529 MG/ML IV SOLN COMPARISON:  Ultrasound 04/12/2018 FINDINGS: Lower chest: Subsegmental atelectasis noted within the lung bases with overlying posterior pleural thickening. Hepatobiliary: Mild hepatic steatosis. There is significant motion artifact which diminishes exam detail within the liver. No focal liver abnormality identified. Gallbladder distension noted. Multiple tiny stones are identified layering within the  gallbladder measuring around 3 mm. The gallbladder wall appears mildly prominent as noted on ultrasound from 04/12/2018. No intrahepatic biliary ductal dilatation. The common bile duct has a normal caliber measuring 6 mm. No choledocholithiasis. Pancreas: Within the limitations of motion artifact the pancreas appears unremarkable. No inflammation or main duct dilatation. Spleen:  Spleen is unremarkable. Adrenals/Urinary Tract: Normal appearance of the adrenal glands. No kidney mass or hydronephrosis identified. Stomach/Bowel: Visualized portions within the abdomen are unremarkable. Vascular/Lymphatic: No pathologically enlarged lymph nodes identified. No abdominal aortic aneurysm demonstrated. Other:  None. Musculoskeletal: No suspicious bone lesions identified. IMPRESSION: 1. Exam detail diminished secondary to motion artifact. 2. Gallstones and mild gallbladder wall prominence as demonstrated on recent ultrasound from 04/12/2018. No significant biliary ductal dilatation or evidence of choledocholithiasis. 3. Hepatic steatosis. Electronically Signed   By: Ladona Ridgelaylor  Bradly Chris M.D.   On: 04/13/2018 16:07   US Abdomen Limited Ruq  Result Date: 04/12/2018 CLINICAL DATA:  RIGHT UPPER QUADRANT pain, nausea, and vomiting for 2 days. EXAM: ULTRASOUND ABDOMEN LIMITED RIGHT UPPER QUADRANT COMPARISON:  None available FINDINGS: Gallbladder: Gallbladder is distended and contains numerous small stones, measuring on the order of 3 millimeters in diameter. Gallbladder wall is mildly thickened, 4.1 millimeters. No sonographic Murphy sign. No pericholecystic fluid. Common bile duct: Diameter: 1.6 millimeters Liver: No focal lesion identified. Within normal limits in parenchymal echogenicity. Portal vein is patent on color Doppler imaging with normal direction of blood flow towards the liver. IMPRESSION: 1. Cholelithiasis and mild gallbladder wall thickening. 2. Negative sonographic Murphy's sign. Electronically Signed   By: Norva Pavlov M.D.   On: 04/12/2018 18:01      Scheduled Meds: . acetaminophen  1,000 mg Oral Q8H  . lip balm      . ondansetron (ZOFRAN) IV  4 mg Intravenous Once   Continuous Infusions: . cefTRIAXone (ROCEPHIN)  IV    . dextrose 5 % and 0.45 % NaCl with KCl 20 mEq/L 100 mL/hr at 04/14/18 1347  . famotidine (PEPCID) IV 20 mg (04/13/18 2309)  . metronidazole 500 mg (04/14/18 1423)     LOS: 2 days    Time spent: Total of 25 minutes spent with pt, greater than 50% of which was spent in discussion of  treatment, counseling and coordination of care   Latrelle Dodrill, MD Pager: Text Page via [email or web address]   If 7PM-7AM, please contact night-coverage [email or web address] 04/14/2018, 3:41 PM   Note - This record has been created using [employer]. Chart creation errors have been sought, but may not always have been located. Such creation errors do not reflect on the standard of medical care.

## 2018-04-14 NOTE — Consult Note (Signed)
Reason for Consult: Choledocholithiasis Referring Physician: [facility]  Alfonse AlpersEnedina Lombard HPI: This is a 47 year old female s/p lap chole for symptomatic gallstones.  She initially presented to [facility] with complaints of epigastric pain and RUQ pain associated with PO intake.  A RUQ U/S was performed and it was positive for gallstones and cholecystitis with a normal CBD.  Her liver enzymes were abnormal and noted in the in an obstructive pattern.  A MRCP was obtained and it was negative for any stones.  Her lap chole was performed today without complications, but the IOC was suspicious for a triangular filling defect in the distal CBD.  As a result a GI consultation was requested  History reviewed. No pertinent past medical history.  Past Surgical History:  Procedure Laterality Date  . CESAREAN SECTION      History reviewed. No pertinent family history.  Social History:  reports that she has never smoked. She has never used smokeless tobacco. She reports that she drinks alcohol. She reports that she does not use drugs.  Allergies: No Known Allergies  Medications:  Scheduled: . acetaminophen  1,000 mg Oral Q8H  . gabapentin  300 mg Oral TID  . ibuprofen  800 mg Oral TID   Continuous: . cefTRIAXone (ROCEPHIN)  IV 200 mL/hr at 04/14/18 1740  . dextrose 5 % and 0.45 % NaCl with KCl 20 mEq/L Stopped (04/14/18 1711)  . famotidine (PEPCID) IV 20 mg (04/13/18 2309)  . methocarbamol (ROBAXIN) IV    . metronidazole Stopped (04/14/18 1524)    Results for orders placed or performed during the hospital encounter of 04/12/18 (from the past 24 hour(s))  Comprehensive metabolic panel     Status: Abnormal   Collection Time: 04/14/18  4:56 AM  Result Value Ref Range   Sodium 138 135 - 145 mmol/L   Potassium 3.3 (L) 3.5 - 5.1 mmol/L   Chloride 108 98 - 111 mmol/L   CO2 22 22 - 32 mmol/L   Glucose, Bld 105 (H) 70 - 99 mg/dL   BUN <5 (L) 6 - 20 mg/dL   Creatinine, Ser 1.610.38 (L)  0.44 - 1.00 mg/dL   Calcium 9.2 8.9 - 09.610.3 mg/dL   Total Protein 6.9 6.5 - 8.1 g/dL   Albumin 3.7 3.5 - 5.0 g/dL   AST 045259 (H) 15 - 41 U/L   ALT 510 (H) 0 - 44 U/L   Alkaline Phosphatase 256 (H) 38 - 126 U/L   Total Bilirubin 2.9 (H) 0.3 - 1.2 mg/dL   GFR calc non Af Amer >60 >60 mL/min   GFR calc Af Amer >60 >60 mL/min   Anion gap 8 5 - 15  CBC with Differential/Platelet     Status: Abnormal   Collection Time: 04/14/18  4:56 AM  Result Value Ref Range   WBC 4.8 4.0 - 10.5 K/uL   RBC 4.46 3.87 - 5.11 MIL/uL   Hemoglobin 7.7 (L) 12.0 - 15.0 g/dL   HCT 40.926.9 (L) 81.136.0 - 91.446.0 %   MCV 60.3 (L) 78.0 - 100.0 fL   MCH 17.3 (L) 26.0 - 34.0 pg   MCHC 28.6 (L) 30.0 - 36.0 g/dL   RDW 78.220.0 (H) 95.611.5 - 21.315.5 %   Platelets 341 150 - 400 K/uL   Neutrophils Relative % 62 %   Neutro Abs 3.0 1.7 - 7.7 K/uL   Lymphocytes Relative 25 %   Lymphs Abs 1.2 0.7 - 4.0 K/uL   Monocytes Relative 12 %  Monocytes Absolute 0.6 0.1 - 1.0 K/uL   Eosinophils Relative 1 %   Eosinophils Absolute 0.1 0.0 - 0.7 K/uL   Basophils Relative 0 %   Basophils Absolute 0.0 0.0 - 0.1 K/uL   RBC Morphology POLYCHROMASIA PRESENT   Surgical pcr screen     Status: None   Collection Time: 04/14/18  9:14 AM  Result Value Ref Range   MRSA, PCR NEGATIVE NEGATIVE   Staphylococcus aureus NEGATIVE NEGATIVE     Dg Cholangiogram Operative  Result Date: 04/14/2018 CLINICAL DATA:  Cholelithiasis EXAM: INTRAOPERATIVE CHOLANGIOGRAM TECHNIQUE: Cholangiographic images from the C-arm fluoroscopic device were submitted for interpretation post-operatively. Please see the procedural report for the amount of contrast and the fluoroscopy time utilized. COMPARISON:  04/13/2018 FINDINGS: Intraoperative cholangiogram performed during the laparoscopic procedure. The residual cystic duct, biliary confluence, common hepatic duct, and common bile duct are patent. Contrast does drain into the duodenum. Small triangular distal CBD persistent filling defect  may represent nonobstructing choledocholithiasis. IMPRESSION: Patent biliary system. Small persistent triangular distal CBD filling defect at the ampulla may represent nonobstructing choledocholithiasis. Electronically Signed   By: Judie Petit.  Shick M.D.   On: 04/14/2018 15:39   Mr 3d Recon At Scanner  Result Date: 04/13/2018 CLINICAL DATA:  Epigastric and right upper quadrant pain with nausea and non bloody vomiting. EXAM: MRI ABDOMEN WITHOUT AND WITH CONTRAST (INCLUDING MRCP) TECHNIQUE: Multiplanar multisequence MR imaging of the abdomen was performed both before and after the administration of intravenous contrast. Heavily T2-weighted images of the biliary and pancreatic ducts were obtained, and three-dimensional MRCP images were rendered by post processing. CONTRAST:  15mL MULTIHANCE GADOBENATE DIMEGLUMINE 529 MG/ML IV SOLN COMPARISON:  Ultrasound 04/12/2018 FINDINGS: Lower chest: Subsegmental atelectasis noted within the lung bases with overlying posterior pleural thickening. Hepatobiliary: Mild hepatic steatosis. There is significant motion artifact which diminishes exam detail within the liver. No focal liver abnormality identified. Gallbladder distension noted. Multiple tiny stones are identified layering within the gallbladder measuring around 3 mm. The gallbladder wall appears mildly prominent as noted on ultrasound from 04/12/2018. No intrahepatic biliary ductal dilatation. The common bile duct has a normal caliber measuring 6 mm. No choledocholithiasis. Pancreas: Within the limitations of motion artifact the pancreas appears unremarkable. No inflammation or main duct dilatation. Spleen:  Spleen is unremarkable. Adrenals/Urinary Tract: Normal appearance of the adrenal glands. No kidney mass or hydronephrosis identified. Stomach/Bowel: Visualized portions within the abdomen are unremarkable. Vascular/Lymphatic: No pathologically enlarged lymph nodes identified. No abdominal aortic aneurysm demonstrated. Other:   None. Musculoskeletal: No suspicious bone lesions identified. IMPRESSION: 1. Exam detail diminished secondary to motion artifact. 2. Gallstones and mild gallbladder wall prominence as demonstrated on recent ultrasound from 04/12/2018. No significant biliary ductal dilatation or evidence of choledocholithiasis. 3. Hepatic steatosis. Electronically Signed   By: Signa Kell M.D.   On: 04/13/2018 16:07   Mr Abdomen Mrcp Vivien Rossetti Contast  Result Date: 04/13/2018 CLINICAL DATA:  Epigastric and right upper quadrant pain with nausea and non bloody vomiting. EXAM: MRI ABDOMEN WITHOUT AND WITH CONTRAST (INCLUDING MRCP) TECHNIQUE: Multiplanar multisequence MR imaging of the abdomen was performed both before and after the administration of intravenous contrast. Heavily T2-weighted images of the biliary and pancreatic ducts were obtained, and three-dimensional MRCP images were rendered by post processing. CONTRAST:  15mL MULTIHANCE GADOBENATE DIMEGLUMINE 529 MG/ML IV SOLN COMPARISON:  Ultrasound 04/12/2018 FINDINGS: Lower chest: Subsegmental atelectasis noted within the lung bases with overlying posterior pleural thickening. Hepatobiliary: Mild hepatic steatosis. There is significant motion artifact which  diminishes exam detail within the liver. No focal liver abnormality identified. Gallbladder distension noted. Multiple tiny stones are identified layering within the gallbladder measuring around 3 mm. The gallbladder wall appears mildly prominent as noted on ultrasound from 04/12/2018. No intrahepatic biliary ductal dilatation. The common bile duct has a normal caliber measuring 6 mm. No choledocholithiasis. Pancreas: Within the limitations of motion artifact the pancreas appears unremarkable. No inflammation or main duct dilatation. Spleen:  Spleen is unremarkable. Adrenals/Urinary Tract: Normal appearance of the adrenal glands. No kidney mass or hydronephrosis identified. Stomach/Bowel: Visualized portions within the  abdomen are unremarkable. Vascular/Lymphatic: No pathologically enlarged lymph nodes identified. No abdominal aortic aneurysm demonstrated. Other:  None. Musculoskeletal: No suspicious bone lesions identified. IMPRESSION: 1. Exam detail diminished secondary to motion artifact. 2. Gallstones and mild gallbladder wall prominence as demonstrated on recent ultrasound from 04/12/2018. No significant biliary ductal dilatation or evidence of choledocholithiasis. 3. Hepatic steatosis. Electronically Signed   By: Signa Kell M.D.   On: 04/13/2018 16:07    ROS:  As stated above in the HPI otherwise negative.  Blood pressure 113/62, pulse 71, temperature 98.1 F (36.7 C), temperature source Oral, resp. rate 20, height 5' (1.524 m), weight 71.3 kg, last menstrual period 04/04/2018, SpO2 97 %.    PE: Gen: NAD, sleepy HEENT:  [place]/AT, EOMI Neck: Supple, no LAD Lungs: CTA Bilaterally CV: RRR without M/G/R ABM: Soft, tender at the incision sites Ext: No C/C/E  Assessment/Plan: 1) Probable choledocholithiasis. 2) Abnormal liver enzymes.   The IOC does show a persistent filling defect.  It is likely a small stone passed since the MRCP.  It is unlikely that this stone will pass spontaneously as it is triangular and the ampulla seems to be long on the IOC.  There was some pancreatic duct filling with the IOC.  The risks and benefits of the ERCP were discussed with the patient.  The risks of bleeding, perforation, infection, and pancreatitis were discussed with the patient and her daughter.  The acknowledge the risks and wish to proceed.  Plan: 1) ERCP tomorrow.  Karston Hyland D 04/14/2018, 6:23 PM

## 2018-04-14 NOTE — Discharge Instructions (Signed)
CIRUGIA LAPAROSCOPICA: INSTRUCCIONES DE POST OPERATORIO. ° °Revise siempre los documentos que le entreguen en el lugar donde se ha hecho la cirugia. ° °SI USTED NECESITA DOCUMENTOS DE INCAPACIDAD (DISABLE) O DE PERMISO FAMILAR (FAMILY LEAVE) NECESITA TRAERLOS A LA OFICINA PARA QUE SEAN PROCESADOS. °NO  SE LOS DE A SU DOCTOR. °1. A su alta del hospital se le dara una receta para controlar el dolor. Tomela como ha sido recetada, si la necesita. Si no la necesita puede tomar, Acetaminofen (Tylenol) o Ibuprofen (Advil) para aliviar dolor moderado. °2. Continue tomando el resto de sus medicinas. °3. Si necesita rellenar la receta, llame a la farmacia. ellos contactan a nuestra oficina pidiendo autorizacion. Este tipo de receta no pueden ser rellenadas despues de las  5pm o durante los fines de semana. °4. Con relacion a la dieta: debe ser ligera los primeros dias despues que llege a la casa. Ejemplo: sopas y galleticas. Tome bastante liquido esos dias. °5. La mayoria de los pacientes padecen de inflamacion y cambio de coloracion de la piel alrededor de las incisiones. esto toma dias en resolver.  pnerse una bolsa de hielo en el area affectada ayuda..  °6. Es comun tambien tener un poco de estrenimiento si esta tomado medicinas para el dolor. incremente la cantidad de liquidos a tomar y puede tomar (Colace) esto previene el problema. Si ya tiene estrenimiento, es decir no ha defecado en 48 horas, puede tomar un laxativo (Milk of Magnesia or Miralax) uselo como el paquete le explica. °7.  A menos que se le diga algo diferente. Remueva el bendaje a las 24-48 horas despues dela cirugia. y puede banarse en la ducha sin ningun problema. usted puede tener steri-strips (pequenas curitas transparentes en la piel puesta encima de la incision)  Estas banditas strips should be left on the skin for 7-10 days.   Si su cirujano puso pegamento encima de la incision usted puede banarse bajo la ducha en 24 horas. Este pegamento empezara a  caerse en las proximas 2-3 semanas. Si le pusieron suturas o presillas (grapos) estos seran quitados en su proxima cita en la oficina. . °a. ACTIVIDADES:  Puede hacer actividad ligera.  Como caminar , subir escaleras y poco a poco irlas incrementando tanto como las tolere. Puede tener relaciones sexuales cuando sea comfortable. No carge objetos pesados o haga esfuerzos que no sean aprovados por su doctor. °b. Puede manejar en cuanto no esta tomando medicamentos fuertes (narcoticos) para el dolor, pueda abrochar confortablemente el cinturon de seguridad, y pueda maniobrar y usar los pedales de su vehiculo con seguridad. °c. PUEDE REGRESAR A TRABAJAR  °8. Debe ver a su doctor para una cita de seguimiento en 2-3 semanas despues de la cirugia.  °9. OTRAS ISNSTRUCCIONES:___________________________________________________________________________________ °CUANDO LLAMAR A SU MEDICO: °1. FIEBRE mayor de  101.0 °2. No produccion de orina. °3. Sangramiento continue de la herida °4. Incremento de dolor, enrojecimientio o drenaje de la herida (incision) °5. Incremento de dolor abdominal. ° °The clinic staff is available to answer your questions during regular business hours.  Please don’t hesitate to call and ask to speak to one of the nurses for clinical concerns.  If you have a medical emergency, go to the nearest emergency room or call 911.  A surgeon from [facility] is always on call at the hospital. °[address], [address], [place], [place]  [address] ? [address] [address], [place], [place]   [address] °[phone] ? [phone] ? FAX [phone] °Web site: [email or web address] ° ° °

## 2018-04-14 NOTE — Transfer of Care (Signed)
Immediate Anesthesia Transfer of Care Note  Patient: Sydney Sandoval  Procedure(s) Performed: Procedure(s): LAPAROSCOPIC CHOLECYSTECTOMY WITH INTRAOPERATIVE CHOLANGIOGRAM (N/A)  Patient Location: PACU  Anesthesia Type:General  Level of Consciousness:  sedated, patient cooperative and responds to stimulation  Airway & Oxygen Therapy:Patient Spontanous Breathing and Patient connected to face mask oxgen  Post-op Assessment:  Report given to PACU RN and Post -op Vital signs reviewed and stable  Post vital signs:  Reviewed and stable  Last Vitals:  Vitals:   04/14/18 0928 04/14/18 1217  BP: (!) 105/49   Pulse: 81 81  Resp: 18 15  Temp: 36.9 C 37.3 C  SpO2: 99% 95%    Complications: No apparent anesthesia complications

## 2018-04-14 NOTE — Anesthesia Postprocedure Evaluation (Signed)
Anesthesia Post Note  Patient: Sydney Sandoval  Procedure(s) Performed: LAPAROSCOPIC CHOLECYSTECTOMY WITH INTRAOPERATIVE CHOLANGIOGRAM (N/A )     Patient location during evaluation: PACU Anesthesia Type: General Level of consciousness: sedated Pain management: pain level controlled Vital Signs Assessment: post-procedure vital signs reviewed and stable Respiratory status: spontaneous breathing and respiratory function stable Cardiovascular status: stable Postop Assessment: no apparent nausea or vomiting Anesthetic complications: no    Last Vitals:  Vitals:   04/14/18 1245 04/14/18 1259  BP: 113/61   Pulse: 68   Resp: (!) 24 20  Temp: 37.3 C   SpO2: 95%     Last Pain:  Vitals:   04/14/18 1245  TempSrc:   PainSc: Asleep                 Yulissa Needham DANIEL

## 2018-04-14 NOTE — Progress Notes (Signed)
We were contacted by General surgery for consult re: inconclusive cholangiogram. Patient has never been seen by us before and has never [facility] before and therefore is unassigned. I have paged Dr. Elnoria HowardHung with [facility] who are covering Unassigned GI patients at [facility] and he will see the patient in consult.  Thank you Hyacinth MeekerJennifer Lawson Isabell, PA-C  Gastroenterology

## 2018-04-14 NOTE — Consult Note (Signed)
[facility] Surgery Consult Note  Sydney Sandoval 1971-08-06  [id number].    Requesting MD: Patrecia Pour Chief Complaint/Reason for Consult: Cholecystitis HPI:  Patient is a 47 year old female who presented to [facility] ED with abdominal pain 04/12/18. She reports pain started on Monday 8/19 but improved and then worsened again 8/20. Pain located in RUQ and dull in nature, was constant but patient currently has no pain. She did not note anything that particularly improved or worsened pain. Associated nausea and vomiting. Emesis was clear like water. Denies fever, chills, diarrhea, constipation, urinary symptoms, chest pain, SOB. Patient with NKDA. Past abdominal surgeries include 2 cesarean sections. No blood thinning medications. She has a history of arthritis for which she takes methotrexate and prednisone.   ROS: Review of Systems  Constitutional: Negative for chills and fever.  Respiratory: Negative for shortness of breath.   Cardiovascular: Negative for chest pain and palpitations.  Gastrointestinal: Positive for abdominal pain, nausea and vomiting. Negative for blood in stool, constipation, diarrhea and melena.  Genitourinary: Negative for dysuria, frequency and urgency.  All other systems reviewed and are negative.   History reviewed. No pertinent family history.  History reviewed. No pertinent past medical history.  Past Surgical History:  Procedure Laterality Date  . CESAREAN SECTION      Social History:  reports that she has never smoked. She has never used smokeless tobacco. She reports that she drinks alcohol. She reports that she does not use drugs.  Allergies: No Known Allergies  Medications Prior to Admission  Medication Sig Dispense Refill  . methotrexate (RHEUMATREX) 2.5 MG tablet Take 10 mg by mouth once a week. Take every Monday    . predniSONE (DELTASONE) 2.5 MG tablet Take 7.5 mg by mouth daily.  1    Blood pressure 98/60, pulse (!) 55, temperature 99 F (37.2  C), temperature source Oral, resp. rate 15, height 5' (1.524 m), weight 71.3 kg, last menstrual period 04/04/2018, SpO2 98 %. Physical Exam: Physical Exam  Constitutional: She is oriented to person, place, and time. She appears well-developed and well-nourished. She is cooperative.  Non-toxic appearance. No distress.  HENT:  Head: Normocephalic and atraumatic.  Right Ear: External ear normal.  Left Ear: External ear normal.  Nose: Nose normal.  Mouth/Throat: Oropharynx is clear and moist and mucous membranes are normal.  Eyes: Conjunctivae, EOM and lids are normal. No scleral icterus.  Pupils equal and round  Neck: Normal range of motion and phonation normal. Neck supple.  Cardiovascular: Normal rate and regular rhythm.  Pulses:      Radial pulses are 2+ on the right side, and 2+ on the left side.       Dorsalis pedis pulses are 2+ on the right side, and 2+ on the left side.  Pulmonary/Chest: Effort normal and breath sounds normal.  Abdominal: Soft. Bowel sounds are normal. She exhibits no distension. There is no hepatosplenomegaly. There is tenderness in the right upper quadrant. There is no rigidity, no rebound, no guarding and negative Murphy's sign. No hernia.  Musculoskeletal:  ROM grossly intact in bilateral upper and lower extremities  Neurological: She is alert and oriented to person, place, and time.  Skin: Skin is warm, dry and intact.  Psychiatric: She has a normal mood and affect. Her speech is normal and behavior is normal.    Results for orders placed or performed during the hospital encounter of 04/12/18 (from the past 48 hour(s))  Urinalysis, Routine w reflex microscopic  Status: Abnormal   Collection Time: 04/12/18  4:12 PM  Result Value Ref Range   Color, Urine YELLOW YELLOW   APPearance CLEAR CLEAR   Specific Gravity, Urine 1.020 1.005 - 1.030   pH 6.0 5.0 - 8.0   Glucose, UA NEGATIVE NEGATIVE mg/dL   Hgb urine dipstick SMALL (A) NEGATIVE   Bilirubin Urine  NEGATIVE NEGATIVE   Ketones, ur 15 (A) NEGATIVE mg/dL   Protein, ur NEGATIVE NEGATIVE mg/dL   Nitrite NEGATIVE NEGATIVE   Leukocytes, UA NEGATIVE NEGATIVE    Comment: Performed at [facility], [address] [place], [place] [address]  Pregnancy, urine     Status: None   Collection Time: 04/12/18  4:12 PM  Result Value Ref Range   Preg Test, Ur NEGATIVE NEGATIVE    Comment:        THE SENSITIVITY OF THIS METHODOLOGY IS >20 mIU/mL. Performed at [facility], [address] [place], [place] [address]   Urinalysis, Microscopic (reflex)     Status: Abnormal   Collection Time: 04/12/18  4:12 PM  Result Value Ref Range   RBC / HPF 0-5 0 - 5 RBC/hpf   WBC, UA NONE SEEN 0 - 5 WBC/hpf   Bacteria, UA RARE (A) NONE SEEN   Squamous Epithelial / LPF 0-5 0 - 5    Comment: Performed at [facility], [address] [place], [place] [address]  CBC with Differential/Platelet     Status: Abnormal   Collection Time: 04/12/18  5:00 PM  Result Value Ref Range   WBC 13.1 (H) 4.0 - 10.5 K/uL   RBC 4.84 3.87 - 5.11 MIL/uL   Hemoglobin 8.3 (L) 12.0 - 15.0 g/dL   HCT 28.6 (L) 36.0 - 46.0 %   MCV 59.1 (L) 78.0 - 100.0 fL   MCH 17.1 (L) 26.0 - 34.0 pg   MCHC 29.0 (L) 30.0 - 36.0 g/dL   RDW 21.1 (H) 11.5 - 15.5 %   Platelets 415 (H) 150 - 400 K/uL   Neutrophils Relative % 84 %   Lymphocytes Relative 9 %   Monocytes Relative 7 %   Eosinophils Relative 0 %   Basophils Relative 0 %   Neutro Abs 11.0 (H) 1.7 - 7.7 K/uL   Lymphs Abs 1.2 0.7 - 4.0 K/uL   Monocytes Absolute 0.9 0.1 - 1.0 K/uL   Eosinophils Absolute 0.0 0.0 - 0.7 K/uL   Basophils Absolute 0.0 0.0 - 0.1 K/uL   RBC Morphology TARGET CELLS     Comment: ELLIPTOCYTES Spherocytes present Performed at [facility], [address] [place], [place] [address]   Comprehensive metabolic panel     Status: Abnormal   Collection Time: 04/12/18  5:00 PM  Result Value Ref Range   Sodium 142 135 - 145  mmol/L   Potassium 3.4 (L) 3.5 - 5.1 mmol/L   Chloride 107 98 - 111 mmol/L   CO2 25 22 - 32 mmol/L   Glucose, Bld 98 70 - 99 mg/dL   BUN 10 6 - 20 mg/dL   Creatinine, Ser 0.38 (L) 0.44 - 1.00 mg/dL   Calcium 9.2 8.9 - 10.3 mg/dL   Total Protein 8.0 6.5 - 8.1 g/dL   Albumin 4.3 3.5 - 5.0 g/dL   AST 834 (H) 15 - 41 U/L   ALT 882 (H) 0 - 44 U/L   Alkaline Phosphatase 325 (H) 38 - 126 U/L   Total Bilirubin  2.6 (H) 0.3 - 1.2 mg/dL   GFR calc non Af Amer >60 >60 mL/min   GFR calc Af Amer >60 >60 mL/min    Comment: (NOTE) The eGFR has been calculated using the CKD EPI equation. This calculation has not been validated in all clinical situations. eGFR's persistently <60 mL/min signify possible Chronic Kidney Disease.    Anion gap 10 5 - 15    Comment: Performed at [facility], [address] [place], [place] [address]  Lipase, blood     Status: Abnormal   Collection Time: 04/12/18  5:00 PM  Result Value Ref Range   Lipase 133 (H) 11 - 51 U/L    Comment: Performed at [facility], [address] [place], [place] [address]  Culture, blood (routine x 2)     Status: None (Preliminary result)   Collection Time: 04/12/18 11:20 PM  Result Value Ref Range   Specimen Description      BLOOD LEFT WRIST Performed at [facility], [place] [address] [place], [place] [address]    Special Requests      AEROBIC BOTTLE ONLY Blood Culture adequate volume Performed at [place] [address] [place], [place] [address]    Culture PENDING    Report Status PENDING   ABO/Rh     Status: None   Collection Time: 04/13/18  5:38 AM  Result Value Ref Range   ABO/RH(D)      O POS Performed at [facility], [place] [address], [place], [place] [address]   HIV antibody (Routine Testing)     Status: None   Collection Time: 04/13/18  5:45 AM  Result Value Ref Range   HIV Screen 4th Generation wRfx Non Reactive Non Reactive    Comment:  (NOTE) Performed At: [facility] [place], [place] [id number] Rush Farmer MD JO:[id number]   Comprehensive metabolic panel     Status: Abnormal   Collection Time: 04/13/18  5:45 AM  Result Value Ref Range   Sodium 140 135 - 145 mmol/L   Potassium 3.3 (L) 3.5 - 5.1 mmol/L   Chloride 110 98 - 111 mmol/L   CO2 21 (L) 22 - 32 mmol/L   Glucose, Bld 86 70 - 99 mg/dL   BUN 8 6 - 20 mg/dL   Creatinine, Ser 0.41 (L) 0.44 - 1.00 mg/dL   Calcium 8.5 (L) 8.9 - 10.3 mg/dL   Total Protein 6.3 (L) 6.5 - 8.1 g/dL   Albumin 3.3 (L) 3.5 - 5.0 g/dL   AST 468 (H) 15 - 41 U/L   ALT 624 (H) 0 - 44 U/L   Alkaline Phosphatase 264 (H) 38 - 126 U/L   Total Bilirubin 4.0 (H) 0.3 - 1.2 mg/dL   GFR calc non Af Amer >60 >60 mL/min   GFR calc Af Amer >60 >60 mL/min    Comment: (NOTE) The eGFR has been calculated using the CKD EPI equation. This calculation has not been validated in all clinical situations. eGFR's persistently <60 mL/min signify possible Chronic Kidney Disease.    Anion gap 9 5 - 15    Comment: Performed at [facility], [place] [address] [place], [place] [address]  CBC WITH DIFFERENTIAL     Status: Abnormal   Collection Time: 04/13/18  5:45 AM  Result Value Ref Range   WBC 9.9 4.0 - 10.5 K/uL   RBC 4.27 3.87 - 5.11 MIL/uL  Hemoglobin 7.4 (L) 12.0 - 15.0 g/dL   HCT 26.1 (L) 36.0 - 46.0 %   MCV 61.1 (L) 78.0 - 100.0 fL   MCH 17.3 (L) 26.0 - 34.0 pg   MCHC 28.4 (L) 30.0 - 36.0 g/dL   RDW 19.5 (H) 11.5 - 15.5 %   Platelets 325 150 - 400 K/uL   Neutrophils Relative % 70 %   Neutro Abs 6.9 1.7 - 7.7 K/uL   Lymphocytes Relative 17 %   Lymphs Abs 1.7 0.7 - 4.0 K/uL   Monocytes Relative 13 %   Monocytes Absolute 1.3 (H) 0.1 - 1.0 K/uL   Eosinophils Relative 0 %   Eosinophils Absolute 0.0 0.0 - 0.7 K/uL   Basophils Relative 0 %   Basophils Absolute 0.0 0.0 - 0.1 K/uL   RBC Morphology TARGET CELLS     Comment: POLYCHROMASIA PRESENT Performed  at [facility], [place] [address] [place], [place] [address]   Magnesium     Status: None   Collection Time: 04/13/18  5:45 AM  Result Value Ref Range   Magnesium 1.9 1.7 - 2.4 mg/dL    Comment: Performed at [facility], [place] [address] [place], [place] [address]  Vitamin B12     Status: None   Collection Time: 04/13/18  5:45 AM  Result Value Ref Range   Vitamin B-12 714 180 - 914 pg/mL    Comment: (NOTE) This assay is not validated for testing neonatal or myeloproliferative syndrome specimens for Vitamin B12 levels. Performed at [facility], [place] [address] [place], [place] [address]   Folate     Status: None   Collection Time: 04/13/18  5:45 AM  Result Value Ref Range   Folate 20.3 >5.9 ng/mL    Comment: Performed at [facility], [place] [address] [place], [place] [address]  Iron and TIBC     Status: Abnormal   Collection Time: 04/13/18  5:45 AM  Result Value Ref Range   Iron 21 (L) 28 - 170 ug/dL   TIBC 415 250 - 450 ug/dL   Saturation Ratios 5 (L) 10.4 - 31.8 %   UIBC 394 ug/dL    Comment: Performed at [facility], [place] [address], [place], [place] [address]  Ferritin     Status: Abnormal   Collection Time: 04/13/18  5:45 AM  Result Value Ref Range   Ferritin 6 (L) 11 - 307 ng/mL    Comment: Performed at [facility], [place] [address] [place], [place] [address]  Reticulocytes     Status: None   Collection Time: 04/13/18  5:45 AM  Result Value Ref Range   Retic Ct Pct 1.3 0.4 - 3.1 %   RBC. 4.27 3.87 - 5.11 MIL/uL   Retic Count, Absolute 55.5 19.0 - 186.0 K/uL    Comment: Performed at [facility], [place] [address] [place], [place] [address]  Type and screen [place]     Status: None   Collection Time: 04/13/18  5:45 AM  Result Value Ref Range   ABO/RH(D) O POS    Antibody Screen NEG    Sample Expiration       04/16/2018 Performed at [facility], [place] [address], [place], [place] [address]   Comprehensive metabolic panel     Status: Abnormal   Collection Time: 04/14/18  4:56 AM  Result Value Ref Range   Sodium 138 135 - 145  mmol/L   Potassium 3.3 (L) 3.5 - 5.1 mmol/L   Chloride 108 98 - 111 mmol/L   CO2 22 22 - 32 mmol/L   Glucose, Bld 105 (H) 70 - 99 mg/dL   BUN <5 (L) 6 - 20 mg/dL   Creatinine, Ser 0.38 (L) 0.44 - 1.00 mg/dL   Calcium 9.2 8.9 - 10.3 mg/dL   Total Protein 6.9 6.5 - 8.1 g/dL   Albumin 3.7 3.5 - 5.0 g/dL   AST 259 (H) 15 - 41 U/L   ALT 510 (H) 0 - 44 U/L   Alkaline Phosphatase 256 (H) 38 - 126 U/L   Total Bilirubin 2.9 (H) 0.3 - 1.2 mg/dL   GFR calc non Af Amer >60 >60 mL/min   GFR calc Af Amer >60 >60 mL/min    Comment: (NOTE) The eGFR has been calculated using the CKD EPI equation. This calculation has not been validated in all clinical situations. eGFR's persistently <60 mL/min signify possible Chronic Kidney Disease.    Anion gap 8 5 - 15    Comment: Performed at [facility], [place] [address], [place], [place] [address]  CBC with Differential/Platelet     Status: Abnormal   Collection Time: 04/14/18  4:56 AM  Result Value Ref Range   WBC 4.8 4.0 - 10.5 K/uL   RBC 4.46 3.87 - 5.11 MIL/uL   Hemoglobin 7.7 (L) 12.0 - 15.0 g/dL   HCT 26.9 (L) 36.0 - 46.0 %   MCV 60.3 (L) 78.0 - 100.0 fL   MCH 17.3 (L) 26.0 - 34.0 pg   MCHC 28.6 (L) 30.0 - 36.0 g/dL   RDW 20.0 (H) 11.5 - 15.5 %   Platelets 341 150 - 400 K/uL   Neutrophils Relative % 62 %   Neutro Abs 3.0 1.7 - 7.7 K/uL   Lymphocytes Relative 25 %   Lymphs Abs 1.2 0.7 - 4.0 K/uL   Monocytes Relative 12 %   Monocytes Absolute 0.6 0.1 - 1.0 K/uL   Eosinophils Relative 1 %   Eosinophils Absolute 0.1 0.0 - 0.7 K/uL   Basophils Relative 0 %   Basophils Absolute 0.0 0.0 - 0.1 K/uL   RBC Morphology POLYCHROMASIA PRESENT     Comment: TARGET CELLS Performed at [facility], [place] [address] [place], [place] [address]    Mr 3d Recon At Scanner  Result Date: 04/13/2018 CLINICAL DATA:  Epigastric and right upper quadrant pain with nausea and non bloody vomiting. EXAM: MRI ABDOMEN WITHOUT AND WITH CONTRAST (INCLUDING MRCP) TECHNIQUE: Multiplanar multisequence MR imaging of the abdomen was performed both before and after the administration of intravenous contrast. Heavily T2-weighted images of the biliary and pancreatic ducts were obtained, and three-dimensional MRCP images were rendered by post processing. CONTRAST:  46m MULTIHANCE GADOBENATE DIMEGLUMINE 529 MG/ML IV SOLN COMPARISON:  Ultrasound 04/12/2018 FINDINGS: Lower chest: Subsegmental atelectasis noted within the lung bases with overlying posterior pleural thickening. Hepatobiliary: Mild hepatic steatosis. There is significant motion artifact which diminishes exam detail within the liver. No focal liver abnormality identified. Gallbladder distension noted. Multiple tiny stones are identified layering within the gallbladder measuring around 3 mm. The gallbladder wall appears mildly prominent as noted on ultrasound from 04/12/2018. No intrahepatic biliary ductal dilatation. The common bile duct has a normal caliber measuring 6 mm. No choledocholithiasis. Pancreas: Within the limitations of motion artifact the pancreas appears unremarkable. No inflammation or main duct dilatation. Spleen:  Spleen is unremarkable. Adrenals/Urinary Tract: Normal appearance of the  adrenal glands. No kidney mass or hydronephrosis identified. Stomach/Bowel: Visualized portions within the abdomen are unremarkable. Vascular/Lymphatic: No pathologically enlarged lymph nodes identified. No abdominal aortic aneurysm demonstrated. Other:  None. Musculoskeletal: No suspicious bone lesions identified. IMPRESSION: 1. Exam detail diminished secondary to motion artifact. 2. Gallstones and mild gallbladder wall prominence as demonstrated on  recent ultrasound from 04/12/2018. No significant biliary ductal dilatation or evidence of choledocholithiasis. 3. Hepatic steatosis. Electronically Signed   By: Kerby Moors M.D.   On: 04/13/2018 16:07   Mr Abdomen Mrcp Moise Boring Contast  Result Date: 04/13/2018 CLINICAL DATA:  Epigastric and right upper quadrant pain with nausea and non bloody vomiting. EXAM: MRI ABDOMEN WITHOUT AND WITH CONTRAST (INCLUDING MRCP) TECHNIQUE: Multiplanar multisequence MR imaging of the abdomen was performed both before and after the administration of intravenous contrast. Heavily T2-weighted images of the biliary and pancreatic ducts were obtained, and three-dimensional MRCP images were rendered by post processing. CONTRAST:  8m MULTIHANCE GADOBENATE DIMEGLUMINE 529 MG/ML IV SOLN COMPARISON:  Ultrasound 04/12/2018 FINDINGS: Lower chest: Subsegmental atelectasis noted within the lung bases with overlying posterior pleural thickening. Hepatobiliary: Mild hepatic steatosis. There is significant motion artifact which diminishes exam detail within the liver. No focal liver abnormality identified. Gallbladder distension noted. Multiple tiny stones are identified layering within the gallbladder measuring around 3 mm. The gallbladder wall appears mildly prominent as noted on ultrasound from 04/12/2018. No intrahepatic biliary ductal dilatation. The common bile duct has a normal caliber measuring 6 mm. No choledocholithiasis. Pancreas: Within the limitations of motion artifact the pancreas appears unremarkable. No inflammation or main duct dilatation. Spleen:  Spleen is unremarkable. Adrenals/Urinary Tract: Normal appearance of the adrenal glands. No kidney mass or hydronephrosis identified. Stomach/Bowel: Visualized portions within the abdomen are unremarkable. Vascular/Lymphatic: No pathologically enlarged lymph nodes identified. No abdominal aortic aneurysm demonstrated. Other:  None. Musculoskeletal: No suspicious bone lesions  identified. IMPRESSION: 1. Exam detail diminished secondary to motion artifact. 2. Gallstones and mild gallbladder wall prominence as demonstrated on recent ultrasound from 04/12/2018. No significant biliary ductal dilatation or evidence of choledocholithiasis. 3. Hepatic steatosis. Electronically Signed   By: TKerby MoorsM.D.   On: 04/13/2018 16:07   UKoreaAbdomen Limited Ruq  Result Date: 04/12/2018 CLINICAL DATA:  RIGHT UPPER QUADRANT pain, nausea, and vomiting for 2 days. EXAM: ULTRASOUND ABDOMEN LIMITED RIGHT UPPER QUADRANT COMPARISON:  None available FINDINGS: Gallbladder: Gallbladder is distended and contains numerous small stones, measuring on the order of 3 millimeters in diameter. Gallbladder wall is mildly thickened, 4.1 millimeters. No sonographic Murphy sign. No pericholecystic fluid. Common bile duct: Diameter: 1.6 millimeters Liver: No focal lesion identified. Within normal limits in parenchymal echogenicity. Portal vein is patent on color Doppler imaging with normal direction of blood flow towards the liver. IMPRESSION: 1. Cholelithiasis and mild gallbladder wall thickening. 2. Negative sonographic Murphy's sign. Electronically Signed   By: ENolon NationsM.D.   On: 04/12/2018 18:01      Assessment/Plan Possible Acute on Chronic cholecystitis Cholelithiasis with possible choledocholithiasis Transaminitis Hyperbilirubinemia - RUQ UKorea cholelithiasis with mild gallbladder wall thickening, no sonographic Murphy's sign - MRCP: no ductal dilatation or choledocholithiasis, study limited by motion artifact - LFTs on admission: ALP 325, AST/ALT 834/882, Tbili 2.6 - LFTs this AM: ALP 256, AST/ALT 259/510, Tbili 2.9 - Tbili was elevated to 4.0 yesterday - Pt may have passed a stone, will attempt cholangiogram  - plan for laparoscopic cholecystectomy later today  FEN: NPO, IVF VTE: SCDs ID: flagyl 8/20>>, rocephin 8/21>>  Brigid Re, [facility] Surgery 04/14/2018, 7:21  AM Pager: [phone] Consults: [phone] Mon-Fri 7:00 am-4:30 pm Sat-Sun 7:00 am-11:30 am

## 2018-04-14 NOTE — H&P (View-Only) (Signed)
Reason for Consult: Choledocholithiasis Referring Physician: [facility]  Sydney Sandoval HPI: This is a 47 year old female s/p lap chole for symptomatic gallstones.  She initially presented to [facility] with complaints of epigastric pain and RUQ pain associated with PO intake.  A RUQ U/S was performed and it was positive for gallstones and cholecystitis with a normal CBD.  Her liver enzymes were abnormal and noted in the in an obstructive pattern.  A MRCP was obtained and it was negative for any stones.  Her lap chole was performed today without complications, but the IOC was suspicious for a triangular filling defect in the distal CBD.  As a result a GI consultation was requested  History reviewed. No pertinent past medical history.  Past Surgical History:  Procedure Laterality Date  . CESAREAN SECTION      History reviewed. No pertinent family history.  Social History:  reports that she has never smoked. She has never used smokeless tobacco. She reports that she drinks alcohol. She reports that she does not use drugs.  Allergies: No Known Allergies  Medications:  Scheduled: . acetaminophen  1,000 mg Oral Q8H  . gabapentin  300 mg Oral TID  . ibuprofen  800 mg Oral TID   Continuous: . cefTRIAXone (ROCEPHIN)  IV 200 mL/hr at 04/14/18 1740  . dextrose 5 % and 0.45 % NaCl with KCl 20 mEq/L Stopped (04/14/18 1711)  . famotidine (PEPCID) IV 20 mg (04/13/18 2309)  . methocarbamol (ROBAXIN) IV    . metronidazole Stopped (04/14/18 1524)    Results for orders placed or performed during the hospital encounter of 04/12/18 (from the past 24 hour(s))  Comprehensive metabolic panel     Status: Abnormal   Collection Time: 04/14/18  4:56 AM  Result Value Ref Range   Sodium 138 135 - 145 mmol/L   Potassium 3.3 (L) 3.5 - 5.1 mmol/L   Chloride 108 98 - 111 mmol/L   CO2 22 22 - 32 mmol/L   Glucose, Bld 105 (H) 70 - 99 mg/dL   BUN <5 (L) 6 - 20 mg/dL   Creatinine, Ser 0.38 (L)  0.44 - 1.00 mg/dL   Calcium 9.2 8.9 - 10.3 mg/dL   Total Protein 6.9 6.5 - 8.1 g/dL   Albumin 3.7 3.5 - 5.0 g/dL   AST 259 (H) 15 - 41 U/L   ALT 510 (H) 0 - 44 U/L   Alkaline Phosphatase 256 (H) 38 - 126 U/L   Total Bilirubin 2.9 (H) 0.3 - 1.2 mg/dL   GFR calc non Af Amer >60 >60 mL/min   GFR calc Af Amer >60 >60 mL/min   Anion gap 8 5 - 15  CBC with Differential/Platelet     Status: Abnormal   Collection Time: 04/14/18  4:56 AM  Result Value Ref Range   WBC 4.8 4.0 - 10.5 K/uL   RBC 4.46 3.87 - 5.11 MIL/uL   Hemoglobin 7.7 (L) 12.0 - 15.0 g/dL   HCT 26.9 (L) 36.0 - 46.0 %   MCV 60.3 (L) 78.0 - 100.0 fL   MCH 17.3 (L) 26.0 - 34.0 pg   MCHC 28.6 (L) 30.0 - 36.0 g/dL   RDW 20.0 (H) 11.5 - 15.5 %   Platelets 341 150 - 400 K/uL   Neutrophils Relative % 62 %   Neutro Abs 3.0 1.7 - 7.7 K/uL   Lymphocytes Relative 25 %   Lymphs Abs 1.2 0.7 - 4.0 K/uL   Monocytes Relative 12 %     Monocytes Absolute 0.6 0.1 - 1.0 K/uL   Eosinophils Relative 1 %   Eosinophils Absolute 0.1 0.0 - 0.7 K/uL   Basophils Relative 0 %   Basophils Absolute 0.0 0.0 - 0.1 K/uL   RBC Morphology POLYCHROMASIA PRESENT   Surgical pcr screen     Status: None   Collection Time: 04/14/18  9:14 AM  Result Value Ref Range   MRSA, PCR NEGATIVE NEGATIVE   Staphylococcus aureus NEGATIVE NEGATIVE     Dg Cholangiogram Operative  Result Date: 04/14/2018 CLINICAL DATA:  Cholelithiasis EXAM: INTRAOPERATIVE CHOLANGIOGRAM TECHNIQUE: Cholangiographic images from the C-arm fluoroscopic device were submitted for interpretation post-operatively. Please see the procedural report for the amount of contrast and the fluoroscopy time utilized. COMPARISON:  04/13/2018 FINDINGS: Intraoperative cholangiogram performed during the laparoscopic procedure. The residual cystic duct, biliary confluence, common hepatic duct, and common bile duct are patent. Contrast does drain into the duodenum. Small triangular distal CBD persistent filling defect  may represent nonobstructing choledocholithiasis. IMPRESSION: Patent biliary system. Small persistent triangular distal CBD filling defect at the ampulla may represent nonobstructing choledocholithiasis. Electronically Signed   By: M.  Shick M.D.   On: 04/14/2018 15:39   Mr 3d Recon At Scanner  Result Date: 04/13/2018 CLINICAL DATA:  Epigastric and right upper quadrant pain with nausea and non bloody vomiting. EXAM: MRI ABDOMEN WITHOUT AND WITH CONTRAST (INCLUDING MRCP) TECHNIQUE: Multiplanar multisequence MR imaging of the abdomen was performed both before and after the administration of intravenous contrast. Heavily T2-weighted images of the biliary and pancreatic ducts were obtained, and three-dimensional MRCP images were rendered by post processing. CONTRAST:  15mL MULTIHANCE GADOBENATE DIMEGLUMINE 529 MG/ML IV SOLN COMPARISON:  Ultrasound 04/12/2018 FINDINGS: Lower chest: Subsegmental atelectasis noted within the lung bases with overlying posterior pleural thickening. Hepatobiliary: Mild hepatic steatosis. There is significant motion artifact which diminishes exam detail within the liver. No focal liver abnormality identified. Gallbladder distension noted. Multiple tiny stones are identified layering within the gallbladder measuring around 3 mm. The gallbladder wall appears mildly prominent as noted on ultrasound from 04/12/2018. No intrahepatic biliary ductal dilatation. The common bile duct has a normal caliber measuring 6 mm. No choledocholithiasis. Pancreas: Within the limitations of motion artifact the pancreas appears unremarkable. No inflammation or main duct dilatation. Spleen:  Spleen is unremarkable. Adrenals/Urinary Tract: Normal appearance of the adrenal glands. No kidney mass or hydronephrosis identified. Stomach/Bowel: Visualized portions within the abdomen are unremarkable. Vascular/Lymphatic: No pathologically enlarged lymph nodes identified. No abdominal aortic aneurysm demonstrated. Other:   None. Musculoskeletal: No suspicious bone lesions identified. IMPRESSION: 1. Exam detail diminished secondary to motion artifact. 2. Gallstones and mild gallbladder wall prominence as demonstrated on recent ultrasound from 04/12/2018. No significant biliary ductal dilatation or evidence of choledocholithiasis. 3. Hepatic steatosis. Electronically Signed   By: Taylor  Stroud M.D.   On: 04/13/2018 16:07   Mr Abdomen Mrcp W Wo Contast  Result Date: 04/13/2018 CLINICAL DATA:  Epigastric and right upper quadrant pain with nausea and non bloody vomiting. EXAM: MRI ABDOMEN WITHOUT AND WITH CONTRAST (INCLUDING MRCP) TECHNIQUE: Multiplanar multisequence MR imaging of the abdomen was performed both before and after the administration of intravenous contrast. Heavily T2-weighted images of the biliary and pancreatic ducts were obtained, and three-dimensional MRCP images were rendered by post processing. CONTRAST:  15mL MULTIHANCE GADOBENATE DIMEGLUMINE 529 MG/ML IV SOLN COMPARISON:  Ultrasound 04/12/2018 FINDINGS: Lower chest: Subsegmental atelectasis noted within the lung bases with overlying posterior pleural thickening. Hepatobiliary: Mild hepatic steatosis. There is significant motion artifact which   diminishes exam detail within the liver. No focal liver abnormality identified. Gallbladder distension noted. Multiple tiny stones are identified layering within the gallbladder measuring around 3 mm. The gallbladder wall appears mildly prominent as noted on ultrasound from 04/12/2018. No intrahepatic biliary ductal dilatation. The common bile duct has a normal caliber measuring 6 mm. No choledocholithiasis. Pancreas: Within the limitations of motion artifact the pancreas appears unremarkable. No inflammation or main duct dilatation. Spleen:  Spleen is unremarkable. Adrenals/Urinary Tract: Normal appearance of the adrenal glands. No kidney mass or hydronephrosis identified. Stomach/Bowel: Visualized portions within the  abdomen are unremarkable. Vascular/Lymphatic: No pathologically enlarged lymph nodes identified. No abdominal aortic aneurysm demonstrated. Other:  None. Musculoskeletal: No suspicious bone lesions identified. IMPRESSION: 1. Exam detail diminished secondary to motion artifact. 2. Gallstones and mild gallbladder wall prominence as demonstrated on recent ultrasound from 04/12/2018. No significant biliary ductal dilatation or evidence of choledocholithiasis. 3. Hepatic steatosis. Electronically Signed   By: Taylor  Stroud M.D.   On: 04/13/2018 16:07    ROS:  As stated above in the HPI otherwise negative.  Blood pressure 113/62, pulse 71, temperature 98.1 F (36.7 C), temperature source Oral, resp. rate 20, height 5' (1.524 m), weight 71.3 kg, last menstrual period 04/04/2018, SpO2 97 %.    PE: Gen: NAD, sleepy HEENT:  [place]/AT, EOMI Neck: Supple, no LAD Lungs: CTA Bilaterally CV: RRR without M/G/R ABM: Soft, tender at the incision sites Ext: No C/C/E  Assessment/Plan: 1) Probable choledocholithiasis. 2) Abnormal liver enzymes.   The IOC does show a persistent filling defect.  It is likely a small stone passed since the MRCP.  It is unlikely that this stone will pass spontaneously as it is triangular and the ampulla seems to be long on the IOC.  There was some pancreatic duct filling with the IOC.  The risks and benefits of the ERCP were discussed with the patient.  The risks of bleeding, perforation, infection, and pancreatitis were discussed with the patient and her daughter.  The acknowledge the risks and wish to proceed.  Plan: 1) ERCP tomorrow.  Sydney Sandoval D 04/14/2018, 6:23 PM     

## 2018-04-14 NOTE — Anesthesia Preprocedure Evaluation (Signed)
Anesthesia Evaluation  Patient identified by MRN, date of birth, ID band Patient awake    Reviewed: Allergy & Precautions, NPO status , Patient's Chart, lab work & pertinent test results  Airway Mallampati: II  TM Distance: >3 FB Neck ROM: Full    Dental no notable dental hx. (+) Teeth Intact   Pulmonary neg pulmonary ROS,    Pulmonary exam normal breath sounds clear to auscultation       Cardiovascular Exercise Tolerance: Good negative cardio ROS Normal cardiovascular exam Rhythm:Regular Rate:Normal     Neuro/Psych negative neurological ROS  negative psych ROS   GI/Hepatic negative GI ROS, Neg liver ROS,   Endo/Other  negative endocrine ROS  Renal/GU negative Renal ROS  negative genitourinary   Musculoskeletal negative musculoskeletal ROS (+)   Abdominal (+) + obese,   Peds negative pediatric ROS (+)  Hematology negative hematology ROS (+) anemia ,   Anesthesia Other Findings   Reproductive/Obstetrics negative OB ROS                             Anesthesia Physical Anesthesia Plan  ASA: II  Anesthesia Plan: General   Post-op Pain Management:    Induction:   PONV Risk Score and Plan: Treatment may vary due to age or medical condition  Airway Management Planned: Oral ETT  Additional Equipment:   Intra-op Plan:   Post-operative Plan: Extubation in OR  Informed Consent: I have reviewed the patients History and Physical, chart, labs and discussed the procedure including the risks, benefits and alternatives for the proposed anesthesia with the patient or authorized representative who has indicated his/her understanding and acceptance.   Dental advisory given  Plan Discussed with:   Anesthesia Plan Comments:         Anesthesia Quick Evaluation

## 2018-04-14 NOTE — Op Note (Signed)
Operative Note  Sydney Sandoval 47 y.o. female [id number]  04/14/2018  Surgeon: Berna Buehelsea A Zimri Brennen MD  Assistant: Will [name] PA-C  Procedure performed: Laparoscopic Cholecystectomy with cholangiogram  Preop diagnosis: cholecystitis, elevated LFTs Post-op diagnosis/intraop findings: same  Specimens: gallbladder  EBL: minimal  Complications: none  Description of procedure: After obtaining informed consent the patient was brought to the operating room. Prophylactic antibiotics and subcutaneous heparin were administered. SCD's were applied. General endotracheal anesthesia was initiated and a formal time-out was performed. The abdomen was prepped and draped in the usual sterile fashion and the abdomen was entered using an infraumbilical veress needle after instilling the site with local. Insufflation to 15mmHg was obtained and gross inspection revealed no evidence of injury from our entry or other intraabdominal abnormalities. Two 5mm trocars were introduced in the right midclavicular and right anterior axillary lines under direct visualization and following infiltration with local. An 11mm trocar was placed in the epigastrium. The gallbladder was acute inflamed and tensely distended. The Nezhat aspirator was used to decompress the gallbladder so it could be retracted cephalad and the infundibulum was retracted laterally. A combination of hook electrocautery and blunt dissection was utilized to clear the peritoneum from the neck and cystic duct, circumferentially isolating the cystic artery and cystic duct and lifting the gallbladder from the cystic plate. The critical view of safety was achieved with the cystic artery, cystic duct, and liver bed visualized between them with no other structures. The artery was clipped with a two clips proximally and one distally and divided.  The cystic duct was short and wide. A clip was placed distally and a ductotomy made sharply. The cholangiogram catheter was  inserted into the cystic duct and secured with a clip. Cholangiogram was performed demonstrating appropriate anatomy and quick emptying into the duodenum. The cholangiogram catheter was removed and three clips were placed on the proximal cystic duct. This was then divided sharply. The gallbladder was dissected from the liver plate using electrocautery. Once freed the gallbladder was placed in an endocatch bag and removed intact through the epigastric trocar site. A small amount of bleeding on the liver bed was controlled with cautery. Some bile had been spilled from the gallbladder during its dissection from the liver bed. This was aspirated and the right upper quadrant was irrigated copiously until the effluent was clear. Hemostasis was once again confirmed, and reinspection of the abdomen revealed no injuries. The clips were well opposed without any bile leak from the duct or the liver bed. The 11mm trocar site in the epigastrium was closed with a 0 vicryl in the fascia under direct visualization using a PMI device. The abdomen was desufflated and all trocars removed. The skin incisions were closed with running subcuticular monocryl and Dermabond. The patient was awakened, extubated and transported to the recovery room in stable condition.   All counts were correct at the completion of the case.

## 2018-04-14 NOTE — Anesthesia Procedure Notes (Signed)
Procedure Name: Intubation Date/Time: 04/14/2018 11:07 AM Performed by: Lavina Hamman, CRNA Pre-anesthesia Checklist: Patient identified, Emergency Drugs available, Suction available, Patient being monitored and Timeout performed Patient Re-evaluated:Patient Re-evaluated prior to induction Oxygen Delivery Method: Circle system utilized Preoxygenation: Pre-oxygenation with 100% oxygen Induction Type: IV induction Ventilation: Mask ventilation without difficulty Laryngoscope Size: Mac and 4 Grade View: Grade I Tube type: Oral Tube size: 7.0 mm Number of attempts: 1 Airway Equipment and Method: Stylet Placement Confirmation: ETT inserted through vocal cords under direct vision,  positive ETCO2,  CO2 detector and breath sounds checked- equal and bilateral Secured at: 21 cm Tube secured with: Tape Dental Injury: Teeth and Oropharynx as per pre-operative assessment

## 2018-04-14 NOTE — Progress Notes (Signed)
Will [name], PA aware via phone pt in severe pain post op. See new IV pain med orders received.

## 2018-04-15 ENCOUNTER — Inpatient Hospital Stay (HOSPITAL_COMMUNITY): Payer: Self-pay

## 2018-04-15 ENCOUNTER — Encounter (HOSPITAL_COMMUNITY): Payer: Self-pay | Admitting: Surgery

## 2018-04-15 ENCOUNTER — Encounter (HOSPITAL_COMMUNITY)
Admission: EM | Disposition: A | Discharge status: Home / Self Care | Payer: Self-pay | Source: Home / Self Care | Attending: Family Medicine

## 2018-04-15 ENCOUNTER — Inpatient Hospital Stay (HOSPITAL_COMMUNITY): Payer: Self-pay | Admitting: Anesthesiology

## 2018-04-15 DIAGNOSIS — Z419 Encounter for procedure for purposes other than remedying health state, unspecified: Secondary | ICD-10-CM

## 2018-04-15 DIAGNOSIS — R933 Abnormal findings on diagnostic imaging of other parts of digestive tract: Secondary | ICD-10-CM

## 2018-04-15 DIAGNOSIS — K8043 Calculus of bile duct with acute cholecystitis with obstruction: Secondary | ICD-10-CM

## 2018-04-15 HISTORY — PX: REMOVAL OF STONES: SHX5545

## 2018-04-15 HISTORY — PX: SPHINCTEROTOMY: SHX5544

## 2018-04-15 HISTORY — PX: ERCP: SHX5425

## 2018-04-15 LAB — CBC
HEMATOCRIT: 24.6 % — AB (ref 36.0–46.0)
Hemoglobin: 7.1 g/dL — ABNORMAL LOW (ref 12.0–15.0)
MCH: 17.6 pg — ABNORMAL LOW (ref 26.0–34.0)
MCHC: 28.9 g/dL — AB (ref 30.0–36.0)
MCV: 61 fL — AB (ref 78.0–100.0)
PLATELETS: 292 10*3/uL (ref 150–400)
RBC: 4.03 MIL/uL (ref 3.87–5.11)
RDW: 20.2 % — ABNORMAL HIGH (ref 11.5–15.5)
WBC: 6.9 10*3/uL (ref 4.0–10.5)

## 2018-04-15 LAB — COMPREHENSIVE METABOLIC PANEL
ALT: 380 U/L — ABNORMAL HIGH (ref 0–44)
ANION GAP: 8 (ref 5–15)
AST: 144 U/L — ABNORMAL HIGH (ref 15–41)
Albumin: 3.6 g/dL (ref 3.5–5.0)
Alkaline Phosphatase: 222 U/L — ABNORMAL HIGH (ref 38–126)
BILIRUBIN TOTAL: 1.3 mg/dL — AB (ref 0.3–1.2)
BUN: <5 mg/dL — ABNORMAL LOW (ref 6–20)
CALCIUM: 8.9 mg/dL (ref 8.9–10.3)
CO2: 22 mmol/L (ref 22–32)
Chloride: 110 mmol/L (ref 98–111)
Creatinine, Ser: 0.5 mg/dL (ref 0.44–1.00)
Glucose, Bld: 165 mg/dL — ABNORMAL HIGH (ref 70–99)
Potassium: 3.5 mmol/L (ref 3.5–5.1)
Sodium: 140 mmol/L (ref 135–145)
TOTAL PROTEIN: 7 g/dL (ref 6.5–8.1)

## 2018-04-15 LAB — MAGNESIUM: MAGNESIUM: 2 mg/dL (ref 1.7–2.4)

## 2018-04-15 LAB — LIPASE, BLOOD: Lipase: 135 U/L — ABNORMAL HIGH (ref 11–51)

## 2018-04-15 SURGERY — ERCP, WITH INTERVENTION IF INDICATED
Anesthesia: General

## 2018-04-15 MED ORDER — PROPOFOL 500 MG/50ML IV EMUL: INTRAVENOUS | Status: DC | PRN

## 2018-04-15 MED ORDER — INDOMETHACIN 50 MG RE SUPP
RECTAL | Status: DC | PRN
  Administered 2018-04-15: 100 mg via RECTAL

## 2018-04-15 MED ORDER — ROCURONIUM BROMIDE 100 MG/10ML IV SOLN
INTRAVENOUS | Status: DC | PRN
  Administered 2018-04-15: 40 mg via INTRAVENOUS

## 2018-04-15 MED ORDER — PROPOFOL 10 MG/ML IV BOLUS
INTRAVENOUS | Status: AC
  Filled 2018-04-15: qty 40

## 2018-04-15 MED ORDER — DEXAMETHASONE SODIUM PHOSPHATE 10 MG/ML IJ SOLN
INTRAMUSCULAR | Status: DC | PRN
  Administered 2018-04-15: 10 mg via INTRAVENOUS

## 2018-04-15 MED ORDER — MIDAZOLAM HCL 5 MG/5ML IJ SOLN: INTRAMUSCULAR | Status: DC | PRN

## 2018-04-15 MED ORDER — SUGAMMADEX SODIUM 200 MG/2ML IV SOLN
INTRAVENOUS | Status: DC | PRN
  Administered 2018-04-15: 150 mg via INTRAVENOUS

## 2018-04-15 MED ORDER — ACETAMINOPHEN 500 MG PO TABS: 1000.0000 mg | ORAL_TABLET | Freq: Three times a day (TID) | ORAL | 0 refills | Status: DC | PRN

## 2018-04-15 MED ORDER — INDOMETHACIN 50 MG RE SUPP
RECTAL | Status: AC
  Filled 2018-04-15: qty 2

## 2018-04-15 MED ORDER — SODIUM CHLORIDE 0.9 % IV SOLN: INTRAVENOUS | Status: DC

## 2018-04-15 MED ORDER — OXYCODONE HCL 5 MG PO TABS: 5.0000 mg | ORAL_TABLET | Freq: Four times a day (QID) | ORAL | 0 refills | Status: DC | PRN

## 2018-04-15 MED ORDER — CIPROFLOXACIN IN D5W 400 MG/200ML IV SOLN
400.0000 mg | Freq: Once | INTRAVENOUS | Status: AC
  Administered 2018-04-15: 400 mg via INTRAVENOUS

## 2018-04-15 MED ORDER — SODIUM CHLORIDE 0.9 % IV SOLN
INTRAVENOUS | Status: DC | PRN
  Administered 2018-04-15: 30 mL

## 2018-04-15 MED ORDER — PROPOFOL 10 MG/ML IV BOLUS
INTRAVENOUS | Status: DC | PRN
  Administered 2018-04-15: 100 mg via INTRAVENOUS

## 2018-04-15 MED ORDER — FENTANYL CITRATE (PF) 100 MCG/2ML IJ SOLN
INTRAMUSCULAR | Status: DC | PRN
  Administered 2018-04-15 (×2): 50 ug via INTRAVENOUS

## 2018-04-15 MED ORDER — LIDOCAINE HCL (CARDIAC) PF 100 MG/5ML IV SOSY
PREFILLED_SYRINGE | INTRAVENOUS | Status: DC | PRN
  Administered 2018-04-15: 50 mg via INTRAVENOUS
  Administered 2018-04-15: 25 mg via INTRATRACHEAL

## 2018-04-15 MED ORDER — CIPROFLOXACIN IN D5W 400 MG/200ML IV SOLN
INTRAVENOUS | Status: AC
  Filled 2018-04-15: qty 200

## 2018-04-15 MED ORDER — FENTANYL CITRATE (PF) 100 MCG/2ML IJ SOLN
INTRAMUSCULAR | Status: AC
  Filled 2018-04-15: qty 2

## 2018-04-15 MED ORDER — ONDANSETRON HCL 4 MG/2ML IJ SOLN
INTRAMUSCULAR | Status: DC | PRN
  Administered 2018-04-15: 4 mg via INTRAVENOUS

## 2018-04-15 MED ORDER — IBUPROFEN 800 MG PO TABS: 800.0000 mg | ORAL_TABLET | Freq: Three times a day (TID) | ORAL | 0 refills | Status: DC | PRN

## 2018-04-15 MED ORDER — LACTATED RINGERS IV SOLN
INTRAVENOUS | Status: DC | PRN
  Administered 2018-04-15: 12:00:00 via INTRAVENOUS

## 2018-04-15 MED ORDER — GLUCAGON HCL RDNA (DIAGNOSTIC) 1 MG IJ SOLR
INTRAMUSCULAR | Status: AC
  Filled 2018-04-15: qty 1

## 2018-04-15 NOTE — Progress Notes (Signed)
[facility] Progress Note  1 Day Post-Op  Subjective: CC: incisional pain Pt reports most superior incision is mildly sore, but pain much improved from initially post-op yesterday. Was tolerating liquids up until MN. Passing flatus. Discussed post-operative care.   Objective: Vital signs in last 24 hours: Temp:  [98.1 F (36.7 C)-99.2 F (37.3 C)] 98.7 F (37.1 C) (08/23 0500) Pulse Rate:  [55-81] 55 (08/23 0500) Resp:  [14-24] 18 (08/23 0500) BP: (97-113)/(49-69) 97/66 (08/23 0500) SpO2:  [95 %-100 %] 98 % (08/23 0500) Weight:  [69.4 kg] 69.4 kg (08/23 0500) Last BM Date: 04/12/18  Intake/Output from previous day: 08/22 0701 - 08/23 0700 In: 2550.8 [I.V.:2048.8; IV Piggyback:502] Out: 725 [Urine:700; Blood:25] Intake/Output this shift: No intake/output data recorded.  PE: Gen:  Alert, NAD, pleasant Card:  Regular rate and rhythm, pedal pulses 2+ BL Pulm:  Normal effort, clear to auscultation bilaterally Abd: Soft, mild tenderness, non-distended, bowel sounds present in all 4 quadrants, no HSM, incisions C/D/I with small amount ecchymosis at superior incision  Skin: warm and dry, no rashes  Psych: A&Ox3   Lab Results:  Recent Labs    04/14/18 0456 04/15/18 0429  WBC 4.8 6.9  HGB 7.7* 7.1*  HCT 26.9* 24.6*  PLT 341 292   BMET Recent Labs    04/14/18 0456 04/15/18 0429  NA 138 140  K 3.3* 3.5  CL 108 110  CO2 22 22  GLUCOSE 105* 165*  BUN <5* <5*  CREATININE 0.38* 0.50  CALCIUM 9.2 8.9   PT/INR No results for input(s): LABPROT, INR in the last 72 hours. CMP     Component Value Date/Time   NA 140 04/15/2018 0429   K 3.5 04/15/2018 0429   CL 110 04/15/2018 0429   CO2 22 04/15/2018 0429   GLUCOSE 165 (H) 04/15/2018 0429   BUN <5 (L) 04/15/2018 0429   CREATININE 0.50 04/15/2018 0429   CALCIUM 8.9 04/15/2018 0429   PROT 7.0 04/15/2018 0429   ALBUMIN 3.6 04/15/2018 0429   AST 144 (H) 04/15/2018 0429   ALT 380 (H) 04/15/2018 0429    ALKPHOS 222 (H) 04/15/2018 0429   BILITOT 1.3 (H) 04/15/2018 0429   GFRNONAA >60 04/15/2018 0429   GFRAA >60 04/15/2018 0429   Lipase     Component Value Date/Time   LIPASE 135 (H) 04/15/2018 0429       Studies/Results: Dg Cholangiogram Operative  Result Date: 04/14/2018 CLINICAL DATA:  Cholelithiasis EXAM: INTRAOPERATIVE CHOLANGIOGRAM TECHNIQUE: Cholangiographic images from the C-arm fluoroscopic device were submitted for interpretation post-operatively. Please see the procedural report for the amount of contrast and the fluoroscopy time utilized. COMPARISON:  04/13/2018 FINDINGS: Intraoperative cholangiogram performed during the laparoscopic procedure. The residual cystic duct, biliary confluence, common hepatic duct, and common bile duct are patent. Contrast does drain into the duodenum. Small triangular distal CBD persistent filling defect may represent nonobstructing choledocholithiasis. IMPRESSION: Patent biliary system. Small persistent triangular distal CBD filling defect at the ampulla may represent nonobstructing choledocholithiasis. Electronically Signed   By: Judie Petit.  Shick M.D.   On: 04/14/2018 15:39   Mr 3d Recon At Scanner  Result Date: 04/13/2018 CLINICAL DATA:  Epigastric and right upper quadrant pain with nausea and non bloody vomiting. EXAM: MRI ABDOMEN WITHOUT AND WITH CONTRAST (INCLUDING MRCP) TECHNIQUE: Multiplanar multisequence MR imaging of the abdomen was performed both before and after the administration of intravenous contrast. Heavily T2-weighted images of the biliary and pancreatic ducts were obtained, and three-dimensional MRCP images were rendered by  post processing. CONTRAST:  15mL MULTIHANCE GADOBENATE DIMEGLUMINE 529 MG/ML IV SOLN COMPARISON:  Ultrasound 04/12/2018 FINDINGS: Lower chest: Subsegmental atelectasis noted within the lung bases with overlying posterior pleural thickening. Hepatobiliary: Mild hepatic steatosis. There is significant motion artifact which  diminishes exam detail within the liver. No focal liver abnormality identified. Gallbladder distension noted. Multiple tiny stones are identified layering within the gallbladder measuring around 3 mm. The gallbladder wall appears mildly prominent as noted on ultrasound from 04/12/2018. No intrahepatic biliary ductal dilatation. The common bile duct has a normal caliber measuring 6 mm. No choledocholithiasis. Pancreas: Within the limitations of motion artifact the pancreas appears unremarkable. No inflammation or main duct dilatation. Spleen:  Spleen is unremarkable. Adrenals/Urinary Tract: Normal appearance of the adrenal glands. No kidney mass or hydronephrosis identified. Stomach/Bowel: Visualized portions within the abdomen are unremarkable. Vascular/Lymphatic: No pathologically enlarged lymph nodes identified. No abdominal aortic aneurysm demonstrated. Other:  None. Musculoskeletal: No suspicious bone lesions identified. IMPRESSION: 1. Exam detail diminished secondary to motion artifact. 2. Gallstones and mild gallbladder wall prominence as demonstrated on recent ultrasound from 04/12/2018. No significant biliary ductal dilatation or evidence of choledocholithiasis. 3. Hepatic steatosis. Electronically Signed   By: Signa Kellaylor  Stroud M.D.   On: 04/13/2018 16:07   Mr Abdomen Mrcp Vivien RossettiW Wo Contast  Result Date: 04/13/2018 CLINICAL DATA:  Epigastric and right upper quadrant pain with nausea and non bloody vomiting. EXAM: MRI ABDOMEN WITHOUT AND WITH CONTRAST (INCLUDING MRCP) TECHNIQUE: Multiplanar multisequence MR imaging of the abdomen was performed both before and after the administration of intravenous contrast. Heavily T2-weighted images of the biliary and pancreatic ducts were obtained, and three-dimensional MRCP images were rendered by post processing. CONTRAST:  15mL MULTIHANCE GADOBENATE DIMEGLUMINE 529 MG/ML IV SOLN COMPARISON:  Ultrasound 04/12/2018 FINDINGS: Lower chest: Subsegmental atelectasis noted within  the lung bases with overlying posterior pleural thickening. Hepatobiliary: Mild hepatic steatosis. There is significant motion artifact which diminishes exam detail within the liver. No focal liver abnormality identified. Gallbladder distension noted. Multiple tiny stones are identified layering within the gallbladder measuring around 3 mm. The gallbladder wall appears mildly prominent as noted on ultrasound from 04/12/2018. No intrahepatic biliary ductal dilatation. The common bile duct has a normal caliber measuring 6 mm. No choledocholithiasis. Pancreas: Within the limitations of motion artifact the pancreas appears unremarkable. No inflammation or main duct dilatation. Spleen:  Spleen is unremarkable. Adrenals/Urinary Tract: Normal appearance of the adrenal glands. No kidney mass or hydronephrosis identified. Stomach/Bowel: Visualized portions within the abdomen are unremarkable. Vascular/Lymphatic: No pathologically enlarged lymph nodes identified. No abdominal aortic aneurysm demonstrated. Other:  None. Musculoskeletal: No suspicious bone lesions identified. IMPRESSION: 1. Exam detail diminished secondary to motion artifact. 2. Gallstones and mild gallbladder wall prominence as demonstrated on recent ultrasound from 04/12/2018. No significant biliary ductal dilatation or evidence of choledocholithiasis. 3. Hepatic steatosis. Electronically Signed   By: Signa Kellaylor  Stroud M.D.   On: 04/13/2018 16:07    Anti-infectives: Anti-infectives (From admission, onward)   Start     Dose/Rate Route Frequency Ordered Stop   04/13/18 1800  cefTRIAXone (ROCEPHIN) 2 g in sodium chloride 0.9 % 100 mL IVPB     2 g 200 mL/hr over 30 Minutes Intravenous Every 24 hours 04/12/18 2212     04/12/18 2230  metroNIDAZOLE (FLAGYL) IVPB 500 mg     500 mg 100 mL/hr over 60 Minutes Intravenous Every 8 hours 04/12/18 2212     04/12/18 1915  cefTRIAXone (ROCEPHIN) 2 g in sodium chloride 0.9 % 100 mL  IVPB     2 g 200 mL/hr over 30  Minutes Intravenous  Once 04/12/18 1912 04/12/18 2044       Assessment/Plan Choledocholithiasis with cholecystitis - s/p laparoscopic cholecystectomy with IOC 04/14/18 Dr. Fredricka Bonine - POD#1 - cholangiogram positive, GI consulted and Dr. Elnoria Howard to perform ERCP today  - patient doing well overall - fine to have a regular diet after ERCP - incisions c/d/i, pt may shower - mobilize - stable for discharge from a surgery standpoint either after ERCP today if doing well or tomorrow AM - follow up and Rx in chart, no abx needed at d/c  Transaminitis/Hyperbilirubinemia - LFTs and Tbili trending down   FEN: NPO, IVF VTE: SCDs ID: flagyl 8/20>>, rocephin 8/21>>  LOS: 3 days    Wells Guiles , [facility] 04/15/2018, 7:53 AM Pager: [phone] Consults: [phone] Mon-Fri 7:00 am-4:30 pm Sat-Sun 7:00 am-11:30 am

## 2018-04-15 NOTE — Anesthesia Preprocedure Evaluation (Addendum)
Anesthesia Evaluation  Patient identified by MRN, date of birth, ID band Patient awake    Reviewed: Allergy & Precautions, NPO status , Patient's Chart, lab work & pertinent test results  Airway Mallampati: I  TM Distance: >3 FB Neck ROM: Full    Dental  (+) Teeth Intact, Dental Advisory Given   Pulmonary neg pulmonary ROS,    breath sounds clear to auscultation       Cardiovascular negative cardio ROS   Rhythm:Regular Rate:Normal     Neuro/Psych negative neurological ROS     GI/Hepatic negative GI ROS, Neg liver ROS,   Endo/Other  negative endocrine ROS  Renal/GU negative Renal ROS  negative genitourinary   Musculoskeletal negative musculoskeletal ROS (+)   Abdominal Normal abdominal exam  (+)   Peds  Hematology   Anesthesia Other Findings   Reproductive/Obstetrics                            Anesthesia Physical Anesthesia Plan  ASA: II  Anesthesia Plan: General   Post-op Pain Management:    Induction: Intravenous  PONV Risk Score and Plan: 3 and Ondansetron and Dexamethasone  Airway Management Planned: Oral ETT  Additional Equipment: None  Intra-op Plan:   Post-operative Plan: Extubation in OR  Informed Consent: I have reviewed the patients History and Physical, chart, labs and discussed the procedure including the risks, benefits and alternatives for the proposed anesthesia with the patient or authorized representative who has indicated his/her understanding and acceptance.   Dental advisory given  Plan Discussed with: CRNA  Anesthesia Plan Comments:        Anesthesia Quick Evaluation

## 2018-04-15 NOTE — Op Note (Signed)
[facility] Patient Name: Sydney Sandoval Procedure Date: 04/15/2018 MRN: [id number] Attending MD: Jeani HawkingPatrick Thurmond Hildebran , MD Date of Birth: 1970/09/15 CSN: [id number] Age: [age] Admit Type: Inpatient Procedure:                ERCP Indications:              Common bile duct stone(s) Providers:                Jeani HawkingPatrick Mayerly Kaman, MD, Dwain SarnaPatricia Ford, RN, Beryle BeamsJanie Billups,                            Technician, Anastasio ChampionJanet Evans, CRNA Referring MD:              Medicines:                General Anesthesia Complications:            No immediate complications. Estimated Blood Loss:     Estimated blood loss: none. Procedure:                Pre-Anesthesia Assessment:                           - Prior to the procedure, a History and Physical                            was performed, and patient medications and                            allergies were reviewed. The patient's tolerance of                            previous anesthesia was also reviewed. The risks                            and benefits of the procedure and the sedation                            options and risks were discussed with the patient.                            All questions were answered, and informed consent                            was obtained. Prior Anticoagulants: The patient has                            taken no previous anticoagulant or antiplatelet                            agents. ASA Grade Assessment: I - A normal, healthy                            patient. After reviewing the risks and benefits,  the patient was deemed in satisfactory condition to                            undergo the procedure.                           - Sedation was administered by an anesthesia                            professional. Deep sedation was attained.                           After obtaining informed consent, the scope was                            passed under direct vision. Throughout the                  procedure, the patient's blood pressure, pulse, and                            oxygen saturations were monitored continuously. The                            TJF-Q180V ([id number]) Olympus ERCP was introduced                            through the mouth, and used to inject contrast into                            and used to inject contrast into the bile duct and                            dorsal pancreatic duct. The ERCP was accomplished                            without difficulty. The patient tolerated the                            procedure well. Scope In: Scope Out: Findings:      The major papilla was normal. A short 0.035 inch Soft Jagwire was passed       into the biliary tree. A 15 mm biliary sphincterotomy was made with a       traction (standard) sphincterotome using ERBE electrocautery. There was       no post-sphincterotomy bleeding. The biliary tree was swept with a 12 mm       balloon starting at the bifurcation. Debris was swept from the duct.      The dorsal PD was initially cannulated with the first attempts. The       guidewire was left in the PD and then a secondary wire was used. The CBD       was cannulated during the first attempt with the secondary guidewire and       it was secured in the right intrahepatic ducts. Contrast injection       revealed a mildly dilated CBD  at 7-8 mm. No over filling defect was       identified. A 1.5 cm sphincterotomy was created and copious bile exited       the CBD. Three sweeps of the CBD were performed and there was evidence       of some debris, but no overt stone. The final occlusion cholangiogram       was negative for any retained stones. Impression:               - The major papilla appeared normal.                           - A biliary sphincterotomy was performed.                           - The biliary tree was swept and debris was found. Moderate Sedation:      N/A- Per Anesthesia Care Recommendation:            - Return patient to hospital ward for ongoing care.                           - Resume regular diet.                           - Routine post-op care per Surgery.                           - The patient will need to see a gynecologist as                            she has an iron deficiency anemia secondary to                            menorrhagia. Procedure Code(s):        --- Professional ---                           [phone], Endoscopic retrograde                            cholangiopancreatography (ERCP); with removal of                            calculi/debris from biliary/pancreatic duct(s)                           43262, Endoscopic retrograde                            cholangiopancreatography (ERCP); with                            sphincterotomy/papillotomy Diagnosis Code(s):        --- Professional ---                           K80.50, Calculus of bile duct without cholangitis  or cholecystitis without obstruction CPT copyright 2017 [employer] Association. All rights reserved. The codes documented in this report are preliminary and upon coder review may  be revised to meet current compliance requirements. Jeani Hawking, MD Jeani Hawking, MD 04/15/2018 2:51:16 PM This report has been signed electronically. Number of Addenda: 0

## 2018-04-15 NOTE — Transfer of Care (Signed)
Immediate Anesthesia Transfer of Care Note  Patient: Sydney Sandoval  Procedure(s) Performed: ENDOSCOPIC RETROGRADE CHOLANGIOPANCREATOGRAPHY (ERCP) (N/A ) SPHINCTEROTOMY  Patient Location: PACU  Anesthesia Type:General  Level of Consciousness: awake, alert , oriented and patient cooperative  Airway & Oxygen Therapy: Patient Spontanous Breathing and Patient connected to face mask oxygen  Post-op Assessment: Report given to RN, Post -op Vital signs reviewed and stable and Patient moving all extremities X 4  Post vital signs: stable  Last Vitals:  Vitals Value Taken Time  BP 114/65 04/15/2018  3:00 PM  Temp 36.6 C 04/15/2018  2:59 PM  Pulse 55 04/15/2018  3:01 PM  Resp 18 04/15/2018  3:01 PM  SpO2 92 % 04/15/2018  3:01 PM  Vitals shown include unvalidated device data.  Last Pain:  Vitals:   04/15/18 1459  TempSrc: Oral  PainSc: 0-No pain         Complications: No apparent anesthesia complications

## 2018-04-15 NOTE — Progress Notes (Signed)
PROGRESS NOTE    Sydney Sandoval  ZOX:[id number] DOB: [birth year] DOA: 04/12/2018 PCP: System, Pcp Not In    Brief Narrative:  47 year old female who presented with right-sided abdominal pain, nausea and vomiting.  She does have significant past medical history for arthritis.  Reported 24 hours of acute severe abdominal pain in the epigastrium and right upper quadrant that occur after a meal.  Pain was worse with meals.  On her initial physical examination blood pressure 146/129, heart rate 100, respiratory rate 20, oxygen saturation 100%, with patient was in pain and uncomfortable, moist mucous membranes, lungs clear to auscultation, heart S1-S2 present tachycardic, abdomen was tender in the right upper quadrant, no rebound, no lower extremity edema.  Her white cell count was 13.1, AST 834, ALT 882, alkaline phosphatase 325, total bilirubin is 2.6. Abdominal ultrasonography with cholelithiasis and mild gallbladder wall thickening.  Patient was admitted to the hospital with working diagnosis of acute cholelithiasis.  Assessment & Plan:   Principal Problem:   Acute cholecystitis Active Problems:   Elevated LFTs   Jaundice   Microcytic anemia   Hypokalemia   1. Acute cholecystitis with abnormal cholangiogram. Patient will have ERCP today, will follow on results and further recommendations. Will continue supportive medical therapy with IV fluids, IV antiacids and as needed analgesics. Out of bed as tolerated, this am NPO, advance diet as tolerated post procedure. Continue antibiotic therapy with ceftriaxone and metronidazole IV.  AST, ALT and bilirubins trending down.   2. Hypokalemia. This am K at 3,5 up from 3,3, will continue k repletion with IV fluids, will follow on electrolytes in am. Renal function continue to be stable with serum cr at 0.50. Mg at 2,0  3. Iron deficiency anemia. Hb and hct stable, will need outpatient follow up.     DVT prophylaxis: enoxaparin   Code Status:  Full    Family Communication: I spoke with patient's husband at the bedside and all questions were addressed.  Disposition Plan/ discharge barriers: pending ERCP findings    Consultants:   Surgery  GI  Procedures:   Cholecystectomy  ERCP  Antimicrobials:   Ceftriaxone  Metronidazole     Subjective: Abdominal pain has improved but not back to baseline, no nausea or vomiting, no chest pain or dyspnea, currently NPO for ERCP today.   Objective: Vitals:   04/14/18 1754 04/14/18 2158 04/15/18 0150 04/15/18 0500  BP: 113/62 108/69 (!) 105/55 97/66  Pulse: 71 67 (!) 56 (!) 55  Resp: 20 16 18 18   Temp: 98.1 F (36.7 C) 98.5 F (36.9 C) 99.1 F (37.3 C) 98.7 F (37.1 C)  TempSrc: Oral Oral Oral Oral  SpO2: 97% 99% 97% 98%  Weight:    69.4 kg  Height:        Intake/Output Summary (Last 24 hours) at 04/15/2018 0923 Last data filed at 04/15/2018 8119 Gross per 24 hour  Intake 2550.83 ml  Output 725 ml  Net 1825.83 ml   Filed Weights   04/12/18 1606 04/13/18 0550 04/15/18 0500  Weight: 66.2 kg 71.3 kg 69.4 kg    Examination:   General: Not in pain or dyspnea, deconditioned Neurology: Awake and alert, non focal  E ENT: mild pallor, no icterus, oral mucosa moist Cardiovascular: No JVD. S1-S2 present, rhythmic, no gallops, rubs, or murmurs. No lower extremity edema. Pulmonary: vesicular breath sounds bilaterally, adequate air movement, no wheezing, rhonchi or rales. Gastrointestinal. Abdomen flat, no organomegaly, non tender, no rebound or guarding/ surgical wounds in place.  Skin. No rashes Musculoskeletal: no joint deformities     Data Reviewed: I have personally reviewed following labs and imaging studies  CBC: Recent Labs  Lab 04/12/18 1700 04/13/18 0545 04/14/18 0456 04/15/18 0429  WBC 13.1* 9.9 4.8 6.9  NEUTROABS 11.0* 6.9 3.0  --   HGB 8.3* 7.4* 7.7* 7.1*  HCT 28.6* 26.1* 26.9* 24.6*  MCV 59.1* 61.1* 60.3* 61.0*  PLT 415* 325 341 292   Basic  Metabolic Panel: Recent Labs  Lab 04/12/18 1700 04/13/18 0545 04/14/18 0456 04/15/18 0429  NA 142 140 138 140  K 3.4* 3.3* 3.3* 3.5  CL 107 110 108 110  CO2 25 21* 22 22  GLUCOSE 98 86 105* 165*  BUN 10 8 <5* <5*  CREATININE 0.38* 0.41* 0.38* 0.50  CALCIUM 9.2 8.5* 9.2 8.9  MG  --  1.9  --  2.0   GFR: Estimated Creatinine Clearance: 77.2 mL/min (by C-G formula based on SCr of 0.5 mg/dL). Liver Function Tests: Recent Labs  Lab 04/12/18 1700 04/13/18 0545 04/14/18 0456 04/15/18 0429  AST 834* 468* 259* 144*  ALT 882* 624* 510* 380*  ALKPHOS 325* 264* 256* 222*  BILITOT 2.6* 4.0* 2.9* 1.3*  PROT 8.0 6.3* 6.9 7.0  ALBUMIN 4.3 3.3* 3.7 3.6   Recent Labs  Lab 04/12/18 1700 04/15/18 0429  LIPASE 133* 135*   No results for input(s): AMMONIA in the last 168 hours. Coagulation Profile: No results for input(s): INR, PROTIME in the last 168 hours. Cardiac Enzymes: No results for input(s): CKTOTAL, CKMB, CKMBINDEX, TROPONINI in the last 168 hours. BNP (last 3 results) No results for input(s): PROBNP in the last 8760 hours. HbA1C: No results for input(s): HGBA1C in the last 72 hours. CBG: No results for input(s): GLUCAP in the last 168 hours. Lipid Profile: No results for input(s): CHOL, HDL, LDLCALC, TRIG, CHOLHDL, LDLDIRECT in the last 72 hours. Thyroid Function Tests: No results for input(s): TSH, T4TOTAL, FREET4, T3FREE, THYROIDAB in the last 72 hours. Anemia Panel: Recent Labs    04/13/18 0545  VITAMINB12 714  FOLATE 20.3  FERRITIN 6*  TIBC 415  IRON 21*  RETICCTPCT 1.3      Radiology Studies: I have reviewed all of the imaging during this hospital visit personally     Scheduled Meds: . acetaminophen  1,000 mg Oral Q8H  . gabapentin  300 mg Oral TID  . ibuprofen  800 mg Oral TID   Continuous Infusions: . cefTRIAXone (ROCEPHIN)  IV Stopped (04/14/18 1741)  . dextrose 5 % and 0.45 % NaCl with KCl 20 mEq/L Stopped (04/15/18 0526)  . famotidine  (PEPCID) IV Stopped (04/14/18 2236)  . methocarbamol (ROBAXIN) IV    . metronidazole 500 mg (04/15/18 0526)     LOS: 3 days        Justine Cossin Annett Gulaaniel Leightyn Cina, MD [facility] Pager [phone]

## 2018-04-15 NOTE — Interval H&P Note (Signed)
History and Physical Interval Note:  04/15/2018 12:54 PM  Sydney Sandoval  has presented today for surgery, with the diagnosis of Choledocholithiasis  The various methods of treatment have been discussed with the patient and family. After consideration of risks, benefits and other options for treatment, the patient has consented to  Procedure(s): ENDOSCOPIC RETROGRADE CHOLANGIOPANCREATOGRAPHY (ERCP) (N/A) as a surgical intervention .  The patient's history has been reviewed, patient examined, no change in status, stable for surgery.  I have reviewed the patient's chart and labs.  Questions were answered to the patient's satisfaction.     Satish Hammers D

## 2018-04-15 NOTE — Anesthesia Procedure Notes (Signed)
Procedure Name: Intubation Date/Time: 04/15/2018 1:50 PM Performed by: Lissa Morales, CRNA Pre-anesthesia Checklist: Patient identified, Emergency Drugs available, Suction available and Patient being monitored Patient Re-evaluated:Patient Re-evaluated prior to induction Oxygen Delivery Method: Circle system utilized Preoxygenation: Pre-oxygenation with 100% oxygen Induction Type: IV induction Ventilation: Mask ventilation without difficulty Laryngoscope Size: Mac and 3 Grade View: Grade II Tube type: Oral Tube size: 7.0 mm Number of attempts: 1 Airway Equipment and Method: Stylet and Oral airway Placement Confirmation: ETT inserted through vocal cords under direct vision,  positive ETCO2 and breath sounds checked- equal and bilateral Secured at: 21 cm Tube secured with: Tape Dental Injury: Teeth and Oropharynx as per pre-operative assessment

## 2018-04-15 NOTE — Plan of Care (Signed)
Pt stable with no needs. Pt denied pain or other needs. Family at bedside. Will continue to monitor.

## 2018-04-15 NOTE — Anesthesia Postprocedure Evaluation (Signed)
Anesthesia Post Note  Patient: Sydney Sandoval  Procedure(s) Performed: ENDOSCOPIC RETROGRADE CHOLANGIOPANCREATOGRAPHY (ERCP) (N/A ) SPHINCTEROTOMY     Patient location during evaluation: PACU Anesthesia Type: General Level of consciousness: awake and alert Pain management: pain level controlled Vital Signs Assessment: post-procedure vital signs reviewed and stable Respiratory status: spontaneous breathing, nonlabored ventilation, respiratory function stable and patient connected to nasal cannula oxygen Cardiovascular status: blood pressure returned to baseline and stable Postop Assessment: no apparent nausea or vomiting Anesthetic complications: no    Last Vitals:  Vitals:   04/15/18 1238 04/15/18 1459  BP: 117/66   Pulse: (!) 54 (!) 55  Resp: 16 17  Temp: 36.7 C 36.6 C  SpO2: 99% 93%    Last Pain:  Vitals:   04/15/18 1459  TempSrc: Oral  PainSc: 0-No pain                 Shelton SilvasKevin D Dugan Vanhoesen

## 2018-04-15 NOTE — Progress Notes (Signed)
Patient is NPO for ERCP. Morning meds held until after procedure scheduled for noon.

## 2018-04-16 DIAGNOSIS — K810 Acute cholecystitis: Secondary | ICD-10-CM

## 2018-04-16 LAB — COMPREHENSIVE METABOLIC PANEL
ALBUMIN: 3 g/dL — AB (ref 3.5–5.0)
ALK PHOS: 174 U/L — AB (ref 38–126)
ALT: 283 U/L — ABNORMAL HIGH (ref 0–44)
AST: 87 U/L — AB (ref 15–41)
Anion gap: 7 (ref 5–15)
BILIRUBIN TOTAL: 1 mg/dL (ref 0.3–1.2)
BUN: 8 mg/dL (ref 6–20)
CALCIUM: 8.5 mg/dL — AB (ref 8.9–10.3)
CO2: 23 mmol/L (ref 22–32)
Chloride: 109 mmol/L (ref 98–111)
Creatinine, Ser: 0.42 mg/dL — ABNORMAL LOW (ref 0.44–1.00)
GFR calc Af Amer: >60 mL/min (ref >60)
GFR calc non Af Amer: >60 mL/min (ref >60)
GLUCOSE: 138 mg/dL — AB (ref 70–99)
Potassium: 3.5 mmol/L (ref 3.5–5.1)
Sodium: 139 mmol/L (ref 135–145)
TOTAL PROTEIN: 6 g/dL — AB (ref 6.5–8.1)

## 2018-04-16 LAB — CBC WITH DIFFERENTIAL/PLATELET
BASOS ABS: 0 10*3/uL (ref 0.0–0.1)
Basophils Relative: 0 %
Eosinophils Absolute: 0 10*3/uL (ref 0.0–0.7)
Eosinophils Relative: 0 %
HCT: 23.1 % — ABNORMAL LOW (ref 36.0–46.0)
HEMOGLOBIN: 6.6 g/dL — AB (ref 12.0–15.0)
LYMPHS ABS: 1.2 10*3/uL (ref 0.7–4.0)
Lymphocytes Relative: 19 %
MCH: 17.4 pg — AB (ref 26.0–34.0)
MCHC: 28.6 g/dL — AB (ref 30.0–36.0)
MCV: 60.9 fL — ABNORMAL LOW (ref 78.0–100.0)
MONO ABS: 0.3 10*3/uL (ref 0.1–1.0)
MONOS PCT: 5 %
NEUTROS ABS: 4.9 10*3/uL (ref 1.7–7.7)
Neutrophils Relative %: 76 %
Platelets: 326 10*3/uL (ref 150–400)
RBC: 3.79 MIL/uL — ABNORMAL LOW (ref 3.87–5.11)
RDW: 20.3 % — ABNORMAL HIGH (ref 11.5–15.5)
WBC: 6.4 10*3/uL (ref 4.0–10.5)

## 2018-04-16 LAB — PREPARE RBC (CROSSMATCH)

## 2018-04-16 MED ORDER — FERROUS SULFATE 325 (65 FE) MG PO TBEC: 325.0000 mg | DELAYED_RELEASE_TABLET | Freq: Three times a day (TID) | ORAL | 3 refills | Status: DC

## 2018-04-16 MED ORDER — SODIUM CHLORIDE 0.9 % IV SOLN: INTRAVENOUS | Status: DC | PRN

## 2018-04-16 MED ORDER — SODIUM CHLORIDE 0.9% IV SOLUTION: Freq: Once | INTRAVENOUS | Status: DC

## 2018-04-16 NOTE — Progress Notes (Addendum)
Dr. Gerrit FriendsGerkin called concerning critical low hgb of 6.6. Md to address on rounds. No visible signs of blood tonight other than pt having her period. Pt did report having heavy periods to RN on bedside rounding.

## 2018-04-16 NOTE — Progress Notes (Signed)
Assessment & Plan: POD#2 Choledocholithiasis with cholecystitis - s/p laparoscopic cholecystectomy with IOC 04/14/18 Dr. Fredricka Bonineonnor  ERCP yesterday with sphincterotomy  Tolerating diet  Anemia  Likely multifactorial - operative blood loss, menstrual cycle, anemia  Hgb 6.6 this AM - minimally symptomatic  No sign of active bleeding  Will repeat Hgb at follow up office visit  Transaminitis/Hyperbilirubinemia  LFT's improving  Will repeat at follow up office visit  OK for discharge home this AM from surgical standpoint.  Will arrange follow up at [facility] office with lab work.        Velora Hecklerodd M. Letina Luckett, MD, [facility]       [facility]       Office: [phone]   Chief Complaint: Status post lap chole  Subjective: Patient comfortable, seated at bedside.  Family in room.  Wants to go home.  Tolerating diet.  Objective: Vital signs in last 24 hours: Temp:  [97.8 F (36.6 C)-99.3 F (37.4 C)] 98.6 F (37 C) (08/24 0955) Pulse Rate:  [45-73] 48 (08/24 0955) Resp:  [16-26] 26 (08/24 0955) BP: (90-143)/(52-88) 96/52 (08/24 0955) SpO2:  [93 %-100 %] 98 % (08/24 0955) Weight:  [74.7 kg] 74.7 kg (08/24 0522) Last BM Date: 04/15/18  Intake/Output from previous day: 08/23 0701 - 08/24 0700 In: 4385.7 [P.O.:1200; I.V.:1708.9; IV Piggyback:1476.8] Out: 1101 [Urine:1101] Intake/Output this shift: No intake/output data recorded.  Physical Exam: HEENT - sclerae clear, mucous membranes moist Neck - soft Abdomen - soft without distension; non-tender; no mass; wounds dry and intact Ext - no edema, non-tender Neuro - alert & oriented, no focal deficits  Lab Results:  Recent Labs    04/15/18 0429 04/16/18 0506  WBC 6.9 6.4  HGB 7.1* 6.6*  HCT 24.6* 23.1*  PLT 292 326   BMET Recent Labs    04/15/18 0429 04/16/18 0506  NA 140 139  K 3.5 3.5  CL 110 109  CO2 22 23  GLUCOSE 165* 138*  BUN <5* 8  CREATININE 0.50 0.42*  CALCIUM 8.9 8.5*   PT/INR No results  for input(s): LABPROT, INR in the last 72 hours. Comprehensive Metabolic Panel:    Component Value Date/Time   NA 139 04/16/2018 0506   NA 140 04/15/2018 0429   K 3.5 04/16/2018 0506   K 3.5 04/15/2018 0429   CL 109 04/16/2018 0506   CL 110 04/15/2018 0429   CO2 23 04/16/2018 0506   CO2 22 04/15/2018 0429   BUN 8 04/16/2018 0506   BUN <5 (L) 04/15/2018 0429   CREATININE 0.42 (L) 04/16/2018 0506   CREATININE 0.50 04/15/2018 0429   GLUCOSE 138 (H) 04/16/2018 0506   GLUCOSE 165 (H) 04/15/2018 0429   CALCIUM 8.5 (L) 04/16/2018 0506   CALCIUM 8.9 04/15/2018 0429   AST 87 (H) 04/16/2018 0506   AST 144 (H) 04/15/2018 0429   ALT 283 (H) 04/16/2018 0506   ALT 380 (H) 04/15/2018 0429   ALKPHOS 174 (H) 04/16/2018 0506   ALKPHOS 222 (H) 04/15/2018 0429   BILITOT 1.0 04/16/2018 0506   BILITOT 1.3 (H) 04/15/2018 0429   PROT 6.0 (L) 04/16/2018 0506   PROT 7.0 04/15/2018 0429   ALBUMIN 3.0 (L) 04/16/2018 0506   ALBUMIN 3.6 04/15/2018 0429    Studies/Results: Dg Cholangiogram Operative  Result Date: 04/14/2018 CLINICAL DATA:  Cholelithiasis EXAM: INTRAOPERATIVE CHOLANGIOGRAM TECHNIQUE: Cholangiographic images from the C-arm fluoroscopic device were submitted for interpretation post-operatively. Please see the procedural report for the amount of contrast and  the fluoroscopy time utilized. COMPARISON:  04/13/2018 FINDINGS: Intraoperative cholangiogram performed during the laparoscopic procedure. The residual cystic duct, biliary confluence, common hepatic duct, and common bile duct are patent. Contrast does drain into the duodenum. Small triangular distal CBD persistent filling defect may represent nonobstructing choledocholithiasis. IMPRESSION: Patent biliary system. Small persistent triangular distal CBD filling defect at the ampulla may represent nonobstructing choledocholithiasis. Electronically Signed   By: Judie Petit.  Shick M.D.   On: 04/14/2018 15:39   Dg Ercp Biliary & Pancreatic Ducts  Result  Date: 04/16/2018 CLINICAL DATA:  Sphincterotomy EXAM: ERCP TECHNIQUE: Multiple spot images obtained with the fluoroscopic device and submitted for interpretation post-procedure. FLUOROSCOPY TIME:  Fluoroscopy Time:  1 minutes and 27 seconds Radiation Exposure Index (if provided by the fluoroscopic device): Number of Acquired Spot Images: 0 COMPARISON:  None. FINDINGS: Imaging documents cannulation of the common bile duct and contrast filling the biliary tree. Balloon stone retrieval is noted. IMPRESSION: See above. These images were submitted for radiologic interpretation only. Please see the procedural report for the amount of contrast and the fluoroscopy time utilized. Electronically Signed   By: Jolaine Click M.D.   On: 04/16/2018 08:32      Markeia Harkless M 04/16/2018  Patient ID: Sydney Sandoval, female   DOB: 01-Oct-1970, 47 y.o.   MRN: [id number]

## 2018-04-16 NOTE — Progress Notes (Signed)
Pt alert, oriented, and tolerating diet.  No complaints of pain.  D/C instructions and prescription given.  All questions answered.  Pt d/cd home.

## 2018-04-16 NOTE — Discharge Summary (Signed)
Physician Discharge Summary  Sydney Sandoval WUJ:[id number] DOB: June 21, 1971 DOA: 04/12/2018  PCP: System, Pcp Not In  Admit date: 04/12/2018 Discharge date: 04/16/2018  Admitted From:HOME Disposition: HOME  Recommendations for Outpatient Follow-up:  1. Follow up with PCP in 1-2 weeks 2. Please obtain BMP/CBC in 3 days to follow-up on hemoglobin and hematocrit.  Hemoglobin level was 6.9. 3. Follow-up with general surgery 04/28/2018.  Home Health NONE Equipment/Devices:NONE Discharge Condition: Stable CODE STATUSFULL  Diet recommendation: Cardiac Brief/Interim Summary:47 year old female who presented with right-sided abdominal pain, nausea and vomiting.  She does have significant past medical history for arthritis.  Reported 24 hours of acute severe abdominal pain in the epigastrium and right upper quadrant that occur after a meal.  Pain was worse with meals.  On her initial physical examination blood pressure 146/129, heart rate 100, respiratory rate 20, oxygen saturation 100%, with patient was in pain and uncomfortable, moist mucous membranes, lungs clear to auscultation, heart S1-S2 present tachycardic, abdomen was tender in the right upper quadrant, no rebound, no lower extremity edema.  Her white cell count was 13.1, AST 834, ALT 882, alkaline phosphatase 325, total bilirubin is 2.6. Abdominal ultrasonography with cholelithiasis and mild gallbladder wall thickening.  Patient was admitted to the hospital with working diagnosis of acute cholelithiasis.  Discharge Diagnoses:  Principal Problem:   Acute cholecystitis Active Problems:   Elevated LFTs   Jaundice   Microcytic anemia   Hypokalemia  1. Acute cholecystitis with choledocholithiasis status post laparoscopic cholecystectomy 8 10/15/2017 Dr. Fredricka Bonine.that is post ERCP with sphincterotomy 04/15/2018.  Elevated LFTs improving.  General surgery has seen the patient and okay to discharge from their standpoint.  Follow-up with general surgery  04/28/2018.    2] chronic iron deficiency anemia hemoglobin dropped to 6.6this morning patient reports heavy menstrual bleeding at this time.  Will transfuse 1 unit of packed RBCs Prior to discharge.  Will prescribe iron tablets and have her follow-up with PCP and gynecologist.  3] arthritis patient reports she takes methotrexate and prednisone at home which will be continued. Discharge Instructions  Discharge Instructions    Call MD for:  difficulty breathing, headache or visual disturbances   Complete by:  As directed    Call MD for:  persistant dizziness or light-headedness   Complete by:  As directed    Call MD for:  persistant nausea and vomiting   Complete by:  As directed    Call MD for:  redness, tenderness, or signs of infection (pain, swelling, redness, odor or green/yellow discharge around incision site)   Complete by:  As directed    Call MD for:  severe uncontrolled pain   Complete by:  As directed    Call MD for:  temperature >100.4   Complete by:  As directed    Diet - low sodium heart healthy   Complete by:  As directed    Increase activity slowly   Complete by:  As directed      Allergies as of 04/16/2018   No Known Allergies     Medication List    TAKE these medications   methotrexate 2.5 MG tablet Commonly known as:  RHEUMATREX Take 10 mg by mouth once a week. Take every Monday   oxyCODONE 5 MG immediate release tablet Commonly known as:  Oxy IR/ROXICODONE Take 1 tablet (5 mg total) by mouth every 6 (six) hours as needed for moderate pain or severe pain.   predniSONE 2.5 MG tablet Commonly known as:  DELTASONE Take 7.5  mg by mouth daily.      Follow-up Information    Surgery, [facility]. Go on 04/28/2018.   Specialty:  General Surgery Why:  Follow up scheduled for 11:15 AM. Please arrive 30 min prior to appointment time. Bring photo ID and insurance information.  Contact information: [address] [place]  [address] [phone]          No Known Allergies  Consultations:  Gastroenterology, general surgery   Procedures/Studies: Dg Cholangiogram Operative  Result Date: 04/14/2018 CLINICAL DATA:  Cholelithiasis EXAM: INTRAOPERATIVE CHOLANGIOGRAM TECHNIQUE: Cholangiographic images from the C-arm fluoroscopic device were submitted for interpretation post-operatively. Please see the procedural report for the amount of contrast and the fluoroscopy time utilized. COMPARISON:  04/13/2018 FINDINGS: Intraoperative cholangiogram performed during the laparoscopic procedure. The residual cystic duct, biliary confluence, common hepatic duct, and common bile duct are patent. Contrast does drain into the duodenum. Small triangular distal CBD persistent filling defect may represent nonobstructing choledocholithiasis. IMPRESSION: Patent biliary system. Small persistent triangular distal CBD filling defect at the ampulla may represent nonobstructing choledocholithiasis. Electronically Signed   By: Judie Petit.  Shick M.D.   On: 04/14/2018 15:39   Mr 3d Recon At Scanner  Result Date: 04/13/2018 CLINICAL DATA:  Epigastric and right upper quadrant pain with nausea and non bloody vomiting. EXAM: MRI ABDOMEN WITHOUT AND WITH CONTRAST (INCLUDING MRCP) TECHNIQUE: Multiplanar multisequence MR imaging of the abdomen was performed both before and after the administration of intravenous contrast. Heavily T2-weighted images of the biliary and pancreatic ducts were obtained, and three-dimensional MRCP images were rendered by post processing. CONTRAST:  15mL MULTIHANCE GADOBENATE DIMEGLUMINE 529 MG/ML IV SOLN COMPARISON:  Ultrasound 04/12/2018 FINDINGS: Lower chest: Subsegmental atelectasis noted within the lung bases with overlying posterior pleural thickening. Hepatobiliary: Mild hepatic steatosis. There is significant motion artifact which diminishes exam detail within the liver. No focal liver abnormality identified. Gallbladder  distension noted. Multiple tiny stones are identified layering within the gallbladder measuring around 3 mm. The gallbladder wall appears mildly prominent as noted on ultrasound from 04/12/2018. No intrahepatic biliary ductal dilatation. The common bile duct has a normal caliber measuring 6 mm. No choledocholithiasis. Pancreas: Within the limitations of motion artifact the pancreas appears unremarkable. No inflammation or main duct dilatation. Spleen:  Spleen is unremarkable. Adrenals/Urinary Tract: Normal appearance of the adrenal glands. No kidney mass or hydronephrosis identified. Stomach/Bowel: Visualized portions within the abdomen are unremarkable. Vascular/Lymphatic: No pathologically enlarged lymph nodes identified. No abdominal aortic aneurysm demonstrated. Other:  None. Musculoskeletal: No suspicious bone lesions identified. IMPRESSION: 1. Exam detail diminished secondary to motion artifact. 2. Gallstones and mild gallbladder wall prominence as demonstrated on recent ultrasound from 04/12/2018. No significant biliary ductal dilatation or evidence of choledocholithiasis. 3. Hepatic steatosis. Electronically Signed   By: Signa Kell M.D.   On: 04/13/2018 16:07   Dg Ercp Biliary & Pancreatic Ducts  Result Date: 04/16/2018 CLINICAL DATA:  Sphincterotomy EXAM: ERCP TECHNIQUE: Multiple spot images obtained with the fluoroscopic device and submitted for interpretation post-procedure. FLUOROSCOPY TIME:  Fluoroscopy Time:  1 minutes and 27 seconds Radiation Exposure Index (if provided by the fluoroscopic device): Number of Acquired Spot Images: 0 COMPARISON:  None. FINDINGS: Imaging documents cannulation of the common bile duct and contrast filling the biliary tree. Balloon stone retrieval is noted. IMPRESSION: See above. These images were submitted for radiologic interpretation only. Please see the procedural report for the amount of contrast and the fluoroscopy time utilized. Electronically Signed   By:  Merton Border  Hoss M.D.   On: 04/16/2018 08:32   Mr Abdomen Mrcp Vivien Rossetti Contast  Result Date: 04/13/2018 CLINICAL DATA:  Epigastric and right upper quadrant pain with nausea and non bloody vomiting. EXAM: MRI ABDOMEN WITHOUT AND WITH CONTRAST (INCLUDING MRCP) TECHNIQUE: Multiplanar multisequence MR imaging of the abdomen was performed both before and after the administration of intravenous contrast. Heavily T2-weighted images of the biliary and pancreatic ducts were obtained, and three-dimensional MRCP images were rendered by post processing. CONTRAST:  15mL MULTIHANCE GADOBENATE DIMEGLUMINE 529 MG/ML IV SOLN COMPARISON:  Ultrasound 04/12/2018 FINDINGS: Lower chest: Subsegmental atelectasis noted within the lung bases with overlying posterior pleural thickening. Hepatobiliary: Mild hepatic steatosis. There is significant motion artifact which diminishes exam detail within the liver. No focal liver abnormality identified. Gallbladder distension noted. Multiple tiny stones are identified layering within the gallbladder measuring around 3 mm. The gallbladder wall appears mildly prominent as noted on ultrasound from 04/12/2018. No intrahepatic biliary ductal dilatation. The common bile duct has a normal caliber measuring 6 mm. No choledocholithiasis. Pancreas: Within the limitations of motion artifact the pancreas appears unremarkable. No inflammation or main duct dilatation. Spleen:  Spleen is unremarkable. Adrenals/Urinary Tract: Normal appearance of the adrenal glands. No kidney mass or hydronephrosis identified. Stomach/Bowel: Visualized portions within the abdomen are unremarkable. Vascular/Lymphatic: No pathologically enlarged lymph nodes identified. No abdominal aortic aneurysm demonstrated. Other:  None. Musculoskeletal: No suspicious bone lesions identified. IMPRESSION: 1. Exam detail diminished secondary to motion artifact. 2. Gallstones and mild gallbladder wall prominence as demonstrated on recent ultrasound  from 04/12/2018. No significant biliary ductal dilatation or evidence of choledocholithiasis. 3. Hepatic steatosis. Electronically Signed   By: Signa Kell M.D.   On: 04/13/2018 16:07   US Abdomen Limited Ruq  Result Date: 04/12/2018 CLINICAL DATA:  RIGHT UPPER QUADRANT pain, nausea, and vomiting for 2 days. EXAM: ULTRASOUND ABDOMEN LIMITED RIGHT UPPER QUADRANT COMPARISON:  None available FINDINGS: Gallbladder: Gallbladder is distended and contains numerous small stones, measuring on the order of 3 millimeters in diameter. Gallbladder wall is mildly thickened, 4.1 millimeters. No sonographic Murphy sign. No pericholecystic fluid. Common bile duct: Diameter: 1.6 millimeters Liver: No focal lesion identified. Within normal limits in parenchymal echogenicity. Portal vein is patent on color Doppler imaging with normal direction of blood flow towards the liver. IMPRESSION: 1. Cholelithiasis and mild gallbladder wall thickening. 2. Negative sonographic Murphy's sign. Electronically Signed   By: Norva Pavlov M.D.   On: 04/12/2018 18:01    (Echo, Carotid, EGD, Colonoscopy, ERCP)    Subjective:   Discharge Exam: Vitals:   04/16/18 0522 04/16/18 0955  BP: 97/66 (!) 96/52  Pulse: (!) 45 (!) 48  Resp: 18 (!) 26  Temp: 98.5 F (36.9 C) 98.6 F (37 C)  SpO2: 96% 98%   Vitals:   04/16/18 0150 04/16/18 0151 04/16/18 0522 04/16/18 0955  BP: (!) 90/54 114/62 97/66 (!) 96/52  Pulse: (!) 55  (!) 45 (!) 48  Resp: 18  18 (!) 26  Temp: 98.7 F (37.1 C)  98.5 F (36.9 C) 98.6 F (37 C)  TempSrc: Oral  Oral Oral  SpO2: 97%  96% 98%  Weight:   74.7 kg   Height:        General: Pt is alert, awake, not in acute distress Cardiovascular: RRR, S1/S2 +, no rubs, no gallops Respiratory: CTA bilaterally, no wheezing, no rhonchi Abdominal: Soft, NT, ND, bowel sounds + Extremities: no edema, no cyanosis    The results of significant diagnostics from this  hospitalization (including imaging,  microbiology, ancillary and laboratory) are listed below for reference.     Microbiology: Recent Results (from the past 240 hour(s))  Culture, blood (routine x 2)     Status: None (Preliminary result)   Collection Time: 04/12/18 11:14 PM  Result Value Ref Range Status   Specimen Description   Final    BLOOD LEFT ANTECUBITAL Performed at [facility], [address] [place], [place] [address]    Special Requests   Final    BOTTLES DRAWN AEROBIC AND ANAEROBIC Blood Culture adequate volume Performed at [facility], [address] [place], [place] [address]    Culture   Final    NO GROWTH 3 DAYS Performed at [facility], [address] [place], [place] [address]    Report Status PENDING  Incomplete  Culture, blood (routine x 2)     Status: None (Preliminary result)   Collection Time: 04/12/18 11:20 PM  Result Value Ref Range Status   Specimen Description   Final    BLOOD LEFT WRIST Performed at [facility], [address] [place], [place] [address]    Special Requests   Final    AEROBIC BOTTLE ONLY Blood Culture adequate volume Performed at [facility], [address] [place], [place] [address]    Culture   Final    NO GROWTH 3 DAYS Performed at [facility], [address] [place], [place] [address]    Report Status PENDING  Incomplete  Surgical pcr screen     Status: None   Collection Time: 04/14/18  9:14 AM  Result Value Ref Range Status   MRSA, PCR NEGATIVE NEGATIVE Final   Staphylococcus aureus NEGATIVE NEGATIVE Final    Comment: (NOTE) The Xpert SA Assay (FDA approved for NASAL specimens in patients 45 years of age and older), is one component of a comprehensive surveillance program. It is not intended to diagnose infection nor to guide or monitor treatment. Performed at [facility], [address] [place], [place] [address]      Labs: BNP (last 3  results) No results for input(s): BNP in the last 8760 hours. Basic Metabolic Panel: Recent Labs  Lab 04/12/18 1700 04/13/18 0545 04/14/18 0456 04/15/18 0429 04/16/18 0506  NA 142 140 138 140 139  K 3.4* 3.3* 3.3* 3.5 3.5  CL 107 110 108 110 109  CO2 25 21* 22 22 23   GLUCOSE 98 86 105* 165* 138*  BUN 10 8 <5* <5* 8  CREATININE 0.38* 0.41* 0.38* 0.50 0.42*  CALCIUM 9.2 8.5* 9.2 8.9 8.5*  MG  --  1.9  --  2.0  --    Liver Function Tests: Recent Labs  Lab 04/12/18 1700 04/13/18 0545 04/14/18 0456 04/15/18 0429 04/16/18 0506  AST 834* 468* 259* 144* 87*  ALT 882* 624* 510* 380* 283*  ALKPHOS 325* 264* 256* 222* 174*  BILITOT 2.6* 4.0* 2.9* 1.3* 1.0  PROT 8.0 6.3* 6.9 7.0 6.0*  ALBUMIN 4.3 3.3* 3.7 3.6 3.0*   Recent Labs  Lab 04/12/18 1700 04/15/18 0429  LIPASE 133* 135*   No results for input(s): AMMONIA in the last 168 hours. CBC: Recent Labs  Lab 04/12/18 1700 04/13/18 0545 04/14/18 0456 04/15/18 0429 04/16/18 0506  WBC 13.1* 9.9 4.8 6.9 6.4  NEUTROABS 11.0* 6.9 3.0  --  4.9  HGB 8.3* 7.4* 7.7* 7.1* 6.6*  HCT 28.6* 26.1* 26.9* 24.6* 23.1*  MCV  59.1* 61.1* 60.3* 61.0* 60.9*  PLT 415* 325 341 292 326   Cardiac Enzymes: No results for input(s): CKTOTAL, CKMB, CKMBINDEX, TROPONINI in the last 168 hours. BNP: Invalid input(s): POCBNP CBG: No results for input(s): GLUCAP in the last 168 hours. D-Dimer No results for input(s): DDIMER in the last 72 hours. Hgb A1c No results for input(s): HGBA1C in the last 72 hours. Lipid Profile No results for input(s): CHOL, HDL, LDLCALC, TRIG, CHOLHDL, LDLDIRECT in the last 72 hours. Thyroid function studies No results for input(s): TSH, T4TOTAL, T3FREE, THYROIDAB in the last 72 hours.  Invalid input(s): FREET3 Anemia work up No results for input(s): VITAMINB12, FOLATE, FERRITIN, TIBC, IRON, RETICCTPCT in the last 72 hours. Urinalysis    Component Value Date/Time   COLORURINE YELLOW 04/12/2018 1612    APPEARANCEUR CLEAR 04/12/2018 1612   LABSPEC 1.020 04/12/2018 1612   PHURINE 6.0 04/12/2018 1612   GLUCOSEU NEGATIVE 04/12/2018 1612   HGBUR SMALL (A) 04/12/2018 1612   BILIRUBINUR NEGATIVE 04/12/2018 1612   KETONESUR 15 (A) 04/12/2018 1612   PROTEINUR NEGATIVE 04/12/2018 1612   NITRITE NEGATIVE 04/12/2018 1612   LEUKOCYTESUR NEGATIVE 04/12/2018 1612   Sepsis Labs Invalid input(s): PROCALCITONIN,  WBC,  LACTICIDVEN Microbiology Recent Results (from the past 240 hour(s))  Culture, blood (routine x 2)     Status: None (Preliminary result)   Collection Time: 04/12/18 11:14 PM  Result Value Ref Range Status   Specimen Description   Final    BLOOD LEFT ANTECUBITAL Performed at [facility], [address] [place], [place] [address]    Special Requests   Final    BOTTLES DRAWN AEROBIC AND ANAEROBIC Blood Culture adequate volume Performed at [facility], [address] [place], [place] [address]    Culture   Final    NO GROWTH 3 DAYS Performed at [facility], [address] [place], [place] [address]    Report Status PENDING  Incomplete  Culture, blood (routine x 2)     Status: None (Preliminary result)   Collection Time: 04/12/18 11:20 PM  Result Value Ref Range Status   Specimen Description   Final    BLOOD LEFT WRIST Performed at [facility], [address] [place], [place] [address]    Special Requests   Final    AEROBIC BOTTLE ONLY Blood Culture adequate volume Performed at [facility], [address], [place], [place] [address]    Culture   Final    NO GROWTH 3 DAYS Performed at [facility], [address], [place], [place] [address]    Report Status PENDING  Incomplete  Surgical pcr screen     Status: None   Collection Time: 04/14/18  9:14 AM  Result Value Ref Range Status   MRSA, PCR NEGATIVE NEGATIVE Final   Staphylococcus aureus NEGATIVE NEGATIVE Final    Comment: (NOTE) The  Xpert SA Assay (FDA approved for NASAL specimens in patients 28 years of age and older), is one component of a comprehensive surveillance program. It is not intended to diagnose infection nor to guide or monitor treatment. Performed at [facility], [address], [place], [place] [address]      Time coordinating discharge: 34 minutes  SIGNED:   Alwyn Ren, MD  [facility] 04/16/2018, 11:36 AM Pager   If 7PM-7AM, please contact night-coverage [email or web address] Password [email or web address]

## 2018-04-16 NOTE — Progress Notes (Signed)
Pt refused blood, was told of risks for doing so but stated she "would not get it this time".  MD was notified.

## 2018-04-17 LAB — TYPE AND SCREEN
ABO/RH(D): O POS
Antibody Screen: NEGATIVE
Unit division: 0

## 2018-04-17 LAB — BPAM RBC
Blood Product Expiration Date: 201909122359
Unit Type and Rh: 5100

## 2018-04-18 ENCOUNTER — Encounter (HOSPITAL_COMMUNITY): Payer: Self-pay | Admitting: Gastroenterology

## 2018-04-18 LAB — CULTURE, BLOOD (ROUTINE X 2)
CULTURE: NO GROWTH
Culture: NO GROWTH
Special Requests: ADEQUATE
Special Requests: ADEQUATE

## 2018-04-18 NOTE — Addendum Note (Signed)
Addendum  created 04/18/18 0651 by Elyn PeersAllen, Annora Guderian J, CRNA   Charge Capture section accepted

## 2018-10-11 ENCOUNTER — Encounter: Payer: Self-pay | Admitting: Gastroenterology

## 2018-10-18 ENCOUNTER — Ambulatory Visit: Payer: No Typology Code available for payment source | Admitting: Gastroenterology

## 2018-11-07 ENCOUNTER — Ambulatory Visit: Payer: No Typology Code available for payment source | Admitting: Gastroenterology

## 2018-11-21 ENCOUNTER — Other Ambulatory Visit: Payer: Self-pay

## 2018-11-21 ENCOUNTER — Encounter: Payer: Self-pay | Admitting: Gastroenterology

## 2018-11-21 ENCOUNTER — Telehealth (INDEPENDENT_AMBULATORY_CARE_PROVIDER_SITE_OTHER): Payer: No Typology Code available for payment source | Admitting: Gastroenterology

## 2018-11-21 DIAGNOSIS — R7401 Elevation of levels of liver transaminase levels: Secondary | ICD-10-CM

## 2018-11-21 DIAGNOSIS — D509 Iron deficiency anemia, unspecified: Secondary | ICD-10-CM

## 2018-11-21 DIAGNOSIS — R740 Nonspecific elevation of levels of transaminase and lactic acid dehydrogenase [LDH]: Secondary | ICD-10-CM

## 2018-11-21 NOTE — Progress Notes (Signed)
Chief Complaint: Elevated LAEs, IDA   Referring Provider:     Kurtis Bushman, MD    HPI:    Due to current restrictions/limitations of in-office visits due to the COVID-19 pandemic, this scheduled clinical appointment was converted to a telehealth virtual consultation using WebEx/Zoom.  -Time of medical discussion: 24 minutes -The patient did consent to this virtual visit and is aware of possible charges through their insurance for this visit.  -Names of all parties present: Sydney Sandoval (patient), Fabienne Bruns (CAP; Spanish interpretor), Doristine Locks, DO, [facility] (physician) -Patient location: Home -Physician location: Office  Sydney Sandoval is a 48 y.o. female w hx of cholelithiasis and CDL in 0/2542, s/p lap ccy with + IOC requiring ERCP, now referred to the Gastroenterology Clinic for evaluation of elevated LAEs. Recent labs from 09/2018 n/f AST/ALT/ALP 270/517/144 with normal TBili 0.7. Normal WBC at 5.7 but with microcytic anemia with H/H 8.8/31.5 and MCV/RDW 65/18.6. Normal BMP.   She is currently taking MTX and Prednisone for RA. Has been taking MTX 10 mg weekly and Prednisone 7.5 mg x4 years. Takes ferrous sulfate for known history of IDA per patient.  She states she went for her yearly physical and referred for elevated LAEs, but o/w no abdominal pain, n/v/d/c, and no hx of jaundice, icteric sclera, GIB or confusion/HE.  Aside from cholelithiasis, no known personal or family history of liver disease.  Aside from cholelithiasis, imaging studies on admission in 03/2018 notable for normal RUQ [place] and mild steatosis on MRCP.  Otherwise no masses.   Lap Cholecystectomy completed 03/2018 with positive IOC with filling defect in the distal CBD requiring ERCP with sphincterotomy and balloon sweeps (debris, no stones). Peak AST/ALT/ALP/TBili 834/882/325/4.0, but downtrending at the time of d/c with AST/ALT/ALP 878/283/174 and normal TBili.  No recurrence of index symptoms of  RUQ pain.  Occasional EtOH but no current or prior history of heavy use.  H/H on admission in 03/2018 8.3/28.6 with MCV 59. Ferritin 6, iron 21, iron sat 5%. Normal B12/folate. Transfused 1U for Hgb 6.6. No other history blood transfusions. No prior CBC for comparison but she reports she has had IDA for years. No hx of hematochezia, melena, or hematemesis.  Not avoiding foods and has diet rich in iron-containing foods.  No known family history of Celiac Disease.  Takes iron tablet daily for years. No prior EGD or colonoscopy.  Past medical history, past surgical history, social history, family history, medications, and allergies reviewed in the chart and with patient over the WebEx.  Past Medical History: -Cholelithiasis now s/p ccy and ERCP in 03/2018 - Rheumatoid arthritis -Iron deficiency anemia -History of LTBI-completed rifampin in 2017   Past Surgical History:  Procedure Laterality Date  . CESAREAN SECTION     2 c-sections  . CHOLECYSTECTOMY N/A 04/14/2018   Procedure: LAPAROSCOPIC CHOLECYSTECTOMY WITH INTRAOPERATIVE CHOLANGIOGRAM;  Surgeon: Berna Bue, MD;  Location: [facility];  Service: General;  Laterality: N/A;  . ERCP N/A 04/15/2018   Procedure: ENDOSCOPIC RETROGRADE CHOLANGIOPANCREATOGRAPHY (ERCP);  Surgeon: Jeani Hawking, MD;  Location: [facility];  Service: Endoscopy;  Laterality: N/A;  . REMOVAL OF STONES  04/15/2018   Procedure: REMOVAL OF STONES;  Surgeon: Jeani Hawking, MD;  Location: [facility];  Service: Endoscopy;;  . Dennison Mascot  04/15/2018   Procedure: Dennison Mascot;  Surgeon: Jeani Hawking, MD;  Location: [facility];  Service: Endoscopy;;   Family History  Problem Relation Age of Onset  .  Colon cancer Neg Hx    Social History   Tobacco Use  . Smoking status: Never Smoker  . Smokeless tobacco: Never Used  Substance Use Topics  . Alcohol use: Yes    Comment: occ  . Drug use: Never   Current Outpatient Medications  Medication Sig Dispense Refill   . ferrous sulfate 325 (65 FE) MG EC tablet Take 1 tablet (325 mg total) by mouth 3 (three) times daily with meals. (Patient taking differently: Take 325 mg by mouth daily. )  3  . methotrexate (RHEUMATREX) 2.5 MG tablet Take 10 mg by mouth once a week. Take every Monday    . predniSONE (DELTASONE) 2.5 MG tablet Take 7.5 mg by mouth daily.  1  . oxyCODONE (OXY IR/ROXICODONE) 5 MG immediate release tablet Take 1 tablet (5 mg total) by mouth every 6 (six) hours as needed for moderate pain or severe pain. (Patient not taking: Reported on 11/21/2018) 30 tablet 0   No current facility-administered medications for this visit.    No Known Allergies   Review of Systems: All systems reviewed and negative except where noted in HPI.     Physical Exam:    Physical exam not completed due to the nature of this telehealth communication.  Patient was otherwise alert and oriented and well communicative.   ASSESSMENT AND PLAN;   1) Elevated LAEs: Clinical suspicion for MTX-induced hepatotoxicity.  ALT > AST with normal T bili and only very mildly elevated ALP.  Will plan on extended serologic evaluation to r/o concomittant liver disease.  - Check ANA, ASMA, AMA, A1AT, immunoglobulin panel, tTG, viral hepatitis panel - INR in preparation for possible liver bx -If serologic evaluation unrevealing for concomitant liver disease, low threshold for liver biopsy to establish diagnosis of MTX-induced hepatotoxicity and evaluate for fibrosis - Avoid additional hepatotoxins for now - Continuing MTX for now, but will have low threshold for referring back to [facility] for trial of alternate treatment options pending extended serologic evaluation  2) IDA: By her history, longstanding history of IDA without overt GI blood loss.  Discussed the DDX of IDA, to include GI etiologies and will evaluate further as below: - EGD with duodenal bxs and colonoscopy when able to schedule for elective endoscopic procedures following  current COVID-19 pandemic - Check celiac panel -Check vitamin D for co-malabsorption.  Holding off on additional fat-soluble vitamins -Resume oral iron therapy -Recheck iron panel now.  If still deficient despite ongoing oral iron therapy, may need to consider IV iron -H/H otherwise stable at 8.8/31.5.  Otherwise without hemodynamic instability or clinical symptoms to suggest admission to the hospital.    Shellia Cleverly, DO, [facility]  11/21/2018, 3:40 PM   Sandre Kitty, PA-C

## 2018-11-21 NOTE — Patient Instructions (Signed)
To help prevent the possible spread of infection to our patients, communities, and staff; we will be implementing the following measures:  As of now we are not allowing any visitors/family members to accompany you to any upcoming appointments with [facility]. If you have any concerns about this please contact our office to discuss prior to the appointment.   Please call our office at [phone] to set up your EGD/Colon in 4-6 weeks  It was a pleasure to see you today!  Vito Cirigliano, D.O.

## 2018-11-22 ENCOUNTER — Other Ambulatory Visit (INDEPENDENT_AMBULATORY_CARE_PROVIDER_SITE_OTHER): Payer: Self-pay

## 2018-11-22 DIAGNOSIS — R740 Nonspecific elevation of levels of transaminase and lactic acid dehydrogenase [LDH]: Secondary | ICD-10-CM

## 2018-11-22 DIAGNOSIS — D509 Iron deficiency anemia, unspecified: Secondary | ICD-10-CM

## 2018-11-22 DIAGNOSIS — R7401 Elevation of levels of liver transaminase levels: Secondary | ICD-10-CM

## 2018-11-22 LAB — PROTIME-INR
INR: 1 ratio (ref 0.8–1.0)
Prothrombin Time: 11.3 s (ref 9.6–13.1)

## 2018-11-22 LAB — VITAMIN D 25 HYDROXY (VIT D DEFICIENCY, FRACTURES): VITD: 27.64 ng/mL — ABNORMAL LOW (ref 30.00–100.00)

## 2018-11-28 LAB — IGG, IGA, IGM
IgG (Immunoglobin G), Serum: 1231 mg/dL (ref 600–1640)
IgM, Serum: 266 mg/dL (ref 50–300)
Immunoglobulin A: 474 mg/dL — ABNORMAL HIGH (ref 47–310)

## 2018-11-28 LAB — HEPATITIS B SURFACE ANTIGEN: HEP B S AG: NONREACTIVE

## 2018-11-28 LAB — ANA: Anti Nuclear Antibody (ANA): NEGATIVE

## 2018-11-28 LAB — IRON,TIBC AND FERRITIN PANEL
%SAT: 3 % (calc) — ABNORMAL LOW (ref 16–45)
Ferritin: 3 ng/mL — ABNORMAL LOW (ref 16–232)
Iron: 13 ug/dL — ABNORMAL LOW (ref 40–190)
TIBC: 461 mcg/dL (calc) — ABNORMAL HIGH (ref 250–450)

## 2018-11-28 LAB — CELIAC DISEASE PANEL
(tTG) Ab, IgA: 1 U/mL
(tTG) Ab, IgG: 2 U/mL
Gliadin IgA: 13 Units
Gliadin IgG: 2 Units
IMMUNOGLOBULIN A: 474 mg/dL — AB (ref 47–310)

## 2018-11-28 LAB — HEPATITIS C ANTIBODY
HEP C AB: NONREACTIVE
SIGNAL TO CUT-OFF: 0.02 (ref <1.00)

## 2018-11-28 LAB — HEPATITIS A ANTIBODY, IGM: Hep A IgM: NONREACTIVE

## 2018-11-28 LAB — ANTI-SMOOTH MUSCLE ANTIBODY, IGG: Actin (Smooth Muscle) Antibody (IGG): <20 U (ref <20)

## 2018-11-28 LAB — HEPATITIS B SURFACE ANTIBODY,QUALITATIVE: Hep B S Ab: NONREACTIVE

## 2018-11-28 LAB — MITOCHONDRIAL ANTIBODIES: Mitochondrial M2 Ab, IgG: <=20 U

## 2018-11-28 LAB — ANTI-MICROSOMAL ANTIBODY LIVER / KIDNEY: LKM1 Ab: <=20 U (ref <=20.0)

## 2018-11-28 LAB — ALPHA-1-ANTITRYPSIN: A-1 Antitrypsin, Ser: 145 mg/dL (ref 83–199)

## 2018-11-30 ENCOUNTER — Other Ambulatory Visit: Payer: Self-pay

## 2018-11-30 DIAGNOSIS — E559 Vitamin D deficiency, unspecified: Secondary | ICD-10-CM

## 2018-11-30 DIAGNOSIS — D509 Iron deficiency anemia, unspecified: Secondary | ICD-10-CM

## 2018-11-30 DIAGNOSIS — R7989 Other specified abnormal findings of blood chemistry: Secondary | ICD-10-CM

## 2018-11-30 DIAGNOSIS — K716 Toxic liver disease with hepatitis, not elsewhere classified: Secondary | ICD-10-CM

## 2018-11-30 DIAGNOSIS — R945 Abnormal results of liver function studies: Secondary | ICD-10-CM

## 2018-11-30 DIAGNOSIS — T50905A Adverse effect of unspecified drugs, medicaments and biological substances, initial encounter: Secondary | ICD-10-CM

## 2018-11-30 MED ORDER — HEPATITIS B VAC RECOMBINANT 20 MCG/ML IJ SUSP: INTRAMUSCULAR | 2 refills | Status: DC

## 2018-12-05 ENCOUNTER — Ambulatory Visit (HOSPITAL_COMMUNITY)
Admission: RE | Admit: 2018-12-05 | Discharge: 2018-12-05 | Disposition: A | Discharge status: Home / Self Care | Payer: Self-pay | Source: Ambulatory Visit | Attending: Gastroenterology | Admitting: Gastroenterology

## 2018-12-05 ENCOUNTER — Other Ambulatory Visit: Payer: Self-pay

## 2018-12-05 DIAGNOSIS — D509 Iron deficiency anemia, unspecified: Secondary | ICD-10-CM | POA: Insufficient documentation

## 2018-12-05 MED ORDER — SODIUM CHLORIDE 0.9 % IV SOLN
INTRAVENOUS | Status: DC | PRN
  Administered 2018-12-05: 10:00:00 250 mL via INTRAVENOUS

## 2018-12-05 MED ORDER — SODIUM CHLORIDE 0.9 % IV SOLN
510.0000 mg | INTRAVENOUS | Status: DC
  Administered 2018-12-05: 11:00:00 510 mg via INTRAVENOUS
  Filled 2018-12-05: qty 17

## 2018-12-05 NOTE — Discharge Instructions (Signed)
Anemia Anemia  La anemia es una afeccin en la cual no hay suficientes glbulos rojos o hemoglobina. La hemoglobina es la sustancia de los glbulos rojos que lleva el oxgeno. Cuando no hay suficientes glbulos rojos o hemoglobina (est anmico), su cuerpo no puede recibir el oxgeno suficiente, y es posible que sus rganos no funcionen correctamente. Como Sydney Sandoval, es posible que se sienta muy cansado o sufra otros problemas. Cules son las causas? Las causas ms frecuentes de anemia son:  Sharlyne Pacas. La anemia puede ser causada por un sangrado excesivo dentro o fuera del cuerpo, incluido el sangrado del intestino o del perodo en las mujeres.  Dficit nutricional.  Enfermedad heptica, tiroidea o renal (crnicas).  Trastornos de la mdula sea.  Cncer y tratamientos para Management consultant.  VIH (virus de inmunodeficiencia [place]) y SIDA (sndrome de inmunodeficiencia adquirida).  Tratamientos para el VIH y [place].  Problemas en el bazo.  Enfermedades de [employer].  Infecciones, medicamentos y enfermedades autoinmunes que [employer] glbulos rojos. Cules son los signos o los sntomas? Los sntomas de esta afeccin incluyen los siguientes:  Debilidad leve.  Mareos.  Dolor de [place].  Sensacin de latidos cardacos irregulares o ms rpidos que lo normal (palpitaciones).  Falta de aire, especialmente con el ejercicio.  Palidez.  Sensibilidad al fro.  Dispepsia.  Nuseas.  Dificultad para dormir.  Dificultad para concentrarse. Los sntomas pueden ocurrir repentinamente o Audiological scientist. Si la anemia es leve, es posible que no tenga sntomas. Cmo se diagnostica? Esta afeccin se diagnostica en funcin de lo siguiente:  Anlisis de sangre.  Sus antecedentes mdicos.  Un examen fsico.  Biopsia de mdula sea. Adems, el mdico puede controlar si hay sangre en sus heces (materia fecal) y Education officer, environmental anlisis adicionales para Engineer, manufacturing la  causa del sangrado. Tambin pueden hacerle otros estudios, por ejemplo:  Pruebas de diagnstico por imgenes, como una resonancia magntica (RM) o una exploracin por tomografa computarizada (TC).  Endoscopia.  Colonoscopia. Cmo se trata? El tratamiento de esta afeccin depende de la causa. Si contina perdiendo [employer], es posible que necesite recibir tratamiento en un hospital. El tratamiento puede incluir lo siguiente:  Tomar suplementos de hierro, vitamina B12 o cido flico.  Tomar un medicamento para las hormonas (eritropoyetina) que puede ayudar a Radio producer de glbulos rojos.  Recibir una transfusin de [place]. Esta ser necesaria si pierde [employer].  Realizar cambios en la dieta.  Someterse a [place] para Public house manager. Siga estas indicaciones en su casa:  Tome los medicamentos de venta libre y los recetados solamente como se lo haya indicado el mdico.  Tome los suplementos solamente como se lo haya indicado el mdico.  Siga las instrucciones de la dieta que le hayan dado.  Concurra a todas las visitas de [address] se lo haya indicado el mdico. Esto es importante. Comunquese con un mdico si:  Tiene nuevos sangrados en cualquier parte del cuerpo. Solicite ayuda de inmediato si:  Se siente muy dbil.  Le falta el aire.  Siente dolor en la espalda, el abdomen o el pecho.  Se siente mareado o sufre un desmayo.  Tiene dificultad para concentrarse.  Las heces son alquitranadas, sanguinolentas o negras.  Vomita repetidamente o vomita sangre. Resumen  La anemia es una afeccin en la que no hay suficientes glbulos rojos o la cantidad suficiente de la sustancia de los glbulos rojos que transporta el oxgeno (hemoglobina).  Los sntomas pueden ocurrir repentinamente o Audiological scientist.  Si la anemia es leve, es posible que no tenga sntomas.  Esta afeccin se diagnostica mediante anlisis de sangre y un examen  fsico, y en funcin de sus antecedentes mdicos. Pueden ser necesarios otros estudios.  El tratamiento de esta afeccin depende de la causa de la anemia. Esta informacin no tiene Theme park managercomo fin reemplazar el consejo del mdico. Asegrese de hacerle al mdico cualquier pregunta que tenga. Document Released: 08/10/2005 Document Revised: 11/30/2016 Document Reviewed: 11/30/2016 Elsevier Interactive Patient Education  2019 Elsevier Inc. Ferumoxytol injection What is this medicine? FERUMOXYTOL is an iron complex. Iron is used to make healthy red blood cells, which carry oxygen and nutrients throughout the body. This medicine is used to treat iron deficiency anemia. This medicine may be used for other purposes; ask your health care provider or pharmacist if you have questions. COMMON BRAND NAME(S): Feraheme What should I tell my health care provider before I take this medicine? They need to know if you have any of these conditions: -anemia not caused by low iron levels -high levels of iron in the blood -magnetic resonance imaging (MRI) test scheduled -an unusual or allergic reaction to iron, other medicines, foods, dyes, or preservatives -pregnant or trying to get pregnant -breast-feeding How should I use this medicine? This medicine is for injection into a vein. It is given by a health care professional in a hospital or clinic setting. Talk to your pediatrician regarding the use of this medicine in children. Special care may be needed. Overdosage: If you think you have taken too much of this medicine contact a poison control center or emergency room at once. NOTE: This medicine is only for you. Do not share this medicine with others. What if I miss a dose? It is important not to miss your dose. Call your doctor or health care professional if you are unable to keep an appointment. What may interact with this medicine? This medicine may interact with the following medications: -other iron  products This list may not describe all possible interactions. Give your health care provider a list of all the medicines, herbs, non-prescription drugs, or dietary supplements you use. Also tell them if you smoke, drink alcohol, or use illegal drugs. Some items may interact with your medicine. What should I watch for while using this medicine? Visit your doctor or healthcare professional regularly. Tell your doctor or healthcare professional if your symptoms do not start to get better or if they get worse. You may need blood work done while you are taking this medicine. You may need to follow a special diet. Talk to your doctor. Foods that contain iron include: whole grains/cereals, dried fruits, beans, or peas, leafy green vegetables, and organ meats (liver, kidney). What side effects may I notice from receiving this medicine? Side effects that you should report to your doctor or health care professional as soon as possible: -allergic reactions like skin rash, itching or hives, swelling of the face, lips, or tongue -breathing problems -changes in blood pressure -feeling faint or lightheaded, falls -fever or chills -flushing, sweating, or hot feelings -swelling of the ankles or feet Side effects that usually do not require medical attention (report to your doctor or health care professional if they continue or are bothersome): -diarrhea -headache -nausea, vomiting -stomach pain This list may not describe all possible side effects. Call your doctor for medical advice about side effects. You may report side effects to FDA at 1-800-FDA-1088. Where should I keep my medicine? This drug is given in a  hospital or clinic and will not be stored at home. NOTE: This sheet is a summary. It may not cover all possible information. If you have questions about this medicine, talk to your doctor, pharmacist, or health care provider.  2019 Elsevier/Gold Standard (2016-09-28 20:21:10)

## 2018-12-05 NOTE — Progress Notes (Signed)
PATIENT CARE CENTER NOTE   DIAGNOSIS: Iron Deficiency Anemia      PROVIDER: Shellia Cleverly, DO    PROCEDURE: IV Feraheme      NOTE: [facility] to day hospital for IV Feraheme infusion. No complications with infusion. Observed pt 30 min post-infusion. No adverse reactions noted post infusion. Vital signs stable. AVS given. Pt Alert, Oriented, and ambulatory at discharge.

## 2018-12-08 ENCOUNTER — Telehealth: Payer: Self-pay | Admitting: *Deleted

## 2018-12-08 ENCOUNTER — Ambulatory Visit (HOSPITAL_COMMUNITY): Payer: Self-pay

## 2018-12-08 NOTE — Telephone Encounter (Signed)
Spoke with patient regarding canceling her procedure due to COVID 19. She verbalized understanding and agreed to being rescheduled in May. SM

## 2018-12-08 NOTE — Telephone Encounter (Signed)
No answer left message for patient to let her know that her procedure on May 1 needs to be rescheduled due to the Covid 19 virus will attempt to call her back. SM

## 2018-12-12 ENCOUNTER — Other Ambulatory Visit: Payer: Self-pay

## 2018-12-12 ENCOUNTER — Ambulatory Visit (HOSPITAL_COMMUNITY)
Admission: RE | Admit: 2018-12-12 | Discharge: 2018-12-12 | Disposition: A | Discharge status: Home / Self Care | Payer: Self-pay | Source: Ambulatory Visit | Attending: Gastroenterology | Admitting: Gastroenterology

## 2018-12-12 MED ORDER — SODIUM CHLORIDE 0.9 % IV SOLN
510.0000 mg | Freq: Once | INTRAVENOUS | Status: AC
  Administered 2018-12-12: 510 mg via INTRAVENOUS
  Filled 2018-12-12: qty 17

## 2018-12-12 MED ORDER — SODIUM CHLORIDE 0.9 % IV SOLN
INTRAVENOUS | Status: DC | PRN
  Administered 2018-12-12: 09:00:00 250 mL via INTRAVENOUS

## 2018-12-12 NOTE — Progress Notes (Signed)
Patient received Feraheme via a PIV. Observed for at least 30 minutes post infusion.Tolerated well, vitals stable, discharge instructions given, verbalized understanding. Patient alert, oriented and ambulatory at the time of discharge.  

## 2018-12-23 ENCOUNTER — Encounter: Payer: Self-pay | Admitting: Gastroenterology

## 2018-12-27 ENCOUNTER — Telehealth: Payer: Self-pay | Admitting: *Deleted

## 2018-12-27 DIAGNOSIS — D509 Iron deficiency anemia, unspecified: Secondary | ICD-10-CM

## 2018-12-27 DIAGNOSIS — R7401 Elevation of levels of liver transaminase levels: Secondary | ICD-10-CM

## 2018-12-27 DIAGNOSIS — R740 Nonspecific elevation of levels of transaminase and lactic acid dehydrogenase [LDH]: Secondary | ICD-10-CM

## 2018-12-27 MED ORDER — NA SULFATE-K SULFATE-MG SULF 17.5-3.13-1.6 GM/177ML PO SOLN: ORAL | 0 refills | Status: DC

## 2018-12-27 NOTE — Telephone Encounter (Signed)
Ok to schedule ECL per Dr. Barron Alvine.  Spoke with pt and scheduled procedure for 01-17-19 at 1230.  New Suprep instructions mailed to her in [place].  Suprep rx sent to [facility] on [address] per pt instruction

## 2019-01-15 ENCOUNTER — Telehealth: Payer: Self-pay | Admitting: *Deleted

## 2019-01-15 DIAGNOSIS — D508 Other iron deficiency anemias: Secondary | ICD-10-CM

## 2019-01-15 MED ORDER — NA SULFATE-K SULFATE-MG SULF 17.5-3.13-1.6 GM/177ML PO SOLN: 1.0000 | Freq: Once | ORAL | 0 refills | Status: AC

## 2019-01-15 NOTE — Telephone Encounter (Signed)
Covid-19 travel screening questions  Have you traveled in the last 14 days? No If yes where?  Do you now or have you had a fever in the last 14 days? No  Do you have any respiratory symptoms of shortness of breath or cough now or in the last 14 days? No  Do you have any family members or close contacts with diagnosed or suspected Covid-19? Yes, pt was with a friend who tested positive 12 days ago. She states she was wearing a mask.

## 2019-01-15 NOTE — Telephone Encounter (Signed)
Discussed with Dr. Christella Hartigan results of pre procedure screening questions and patient exposure to close contact who tested positive for Covid-19.   Dr. Christella Hartigan requested pt to be cancelled and rescheduled for procedure outside of the window of 14 days when person was diagnosed. Pt in agreement and wishes to wait as long as possible to have her procedure. She rescheduled to 02/13/19 at 130 pm. Prep rx resent to pharmacy and instructions mailed to patient again as she states she had not received them previously. Address verified in computer.

## 2019-01-17 ENCOUNTER — Encounter: Payer: Self-pay | Admitting: Gastroenterology

## 2019-01-17 NOTE — Telephone Encounter (Signed)
New instructions mailed to pt. 

## 2019-02-10 ENCOUNTER — Telehealth: Payer: Self-pay | Admitting: Gastroenterology

## 2019-02-10 DIAGNOSIS — D509 Iron deficiency anemia, unspecified: Secondary | ICD-10-CM

## 2019-02-10 MED ORDER — SUPREP BOWEL PREP KIT 17.5-3.13-1.6 GM/177ML PO SOLN: ORAL | 0 refills | Status: DC

## 2019-02-10 NOTE — Telephone Encounter (Signed)
Pt answered "yes" to 2 questions. Please advise if procedure needs to be r/s.  Covid-19 Screening Questions:  Do you now or have you had a fever in the last 14 days? No  Do you have any respiratory symptoms of shortness of breath or cough now or in the last 14 days? No   Do you have any family members or close contacts with diagnosed or suspected Covid-19 in the past 14 days? Yes, pt states that she was with a friend, whose daughter was recently found to have antibodies. Pt saw this friend less than 2 weeks ago, she states that her friend is feeling fine, has no sxs and has not being tested.   Have you been tested for Covid-19 and found to be positive? Yes, pt states that she had Covid-19 last April but she has fully recovered.  Pt made aware of that care partner may wait in the car or come up to the lobby during the procedure but will need to provide their own mask.

## 2019-02-10 NOTE — Telephone Encounter (Signed)
No problem, she is good to proceed with the procedure having recovered from [place] in April. Thanks.

## 2019-02-10 NOTE — Telephone Encounter (Signed)
Dr. Vivia Ewing recommendations noted.  Suprep resent to [facility] per pt request

## 2019-02-10 NOTE — Telephone Encounter (Signed)
Dr. Bryan Lemma,  How would you like to proceed?  She answered yes to two of our screening questions- she herself has had the covid back in April but has fully recovered.   Thanks, [employer]

## 2019-02-13 ENCOUNTER — Encounter: Payer: Self-pay | Admitting: Gastroenterology

## 2019-02-13 ENCOUNTER — Ambulatory Visit (AMBULATORY_SURGERY_CENTER): Payer: Self-pay | Admitting: Gastroenterology

## 2019-02-13 ENCOUNTER — Other Ambulatory Visit: Payer: Self-pay

## 2019-02-13 VITALS — BP 118/70 | HR 51 | Temp 98.5°F | Resp 14 | Ht 60.0 in | Wt 157.0 lb

## 2019-02-13 DIAGNOSIS — K3189 Other diseases of stomach and duodenum: Secondary | ICD-10-CM

## 2019-02-13 DIAGNOSIS — K2970 Gastritis, unspecified, without bleeding: Secondary | ICD-10-CM

## 2019-02-13 DIAGNOSIS — K219 Gastro-esophageal reflux disease without esophagitis: Secondary | ICD-10-CM

## 2019-02-13 DIAGNOSIS — D509 Iron deficiency anemia, unspecified: Secondary | ICD-10-CM

## 2019-02-13 DIAGNOSIS — K449 Diaphragmatic hernia without obstruction or gangrene: Secondary | ICD-10-CM

## 2019-02-13 DIAGNOSIS — K5730 Diverticulosis of large intestine without perforation or abscess without bleeding: Secondary | ICD-10-CM

## 2019-02-13 MED ORDER — SODIUM CHLORIDE 0.9 % IV SOLN: 500.0000 mL | Freq: Once | INTRAVENOUS | Status: DC

## 2019-02-13 NOTE — Progress Notes (Signed)
Report given to PACU, vss 

## 2019-02-13 NOTE — Progress Notes (Signed)
Pt states ok to have instructions in [place], states she understands most english and care partner states she lives with her and speaks/reads english. Verbalize understanding of post-procedure instructions.

## 2019-02-13 NOTE — Op Note (Signed)
[facility] Patient Name: Sydney Sandoval Procedure Date: 02/13/2019 1:18 PM MRN: [id number] Endoscopist: Doristine LocksVito Shakil Dirk , MD Age: [age] Referring MD:  Date of Birth: Sep 04, 1970 Gender: Female Account #: [id number] Procedure:                Colonoscopy Indications:              Iron deficiency anemia without overt GI blood loss Medicines:                Monitored Anesthesia Care Procedure:                Pre-Anesthesia Assessment:                           - Prior to the procedure, a History and Physical                            was performed, and patient medications and                            allergies were reviewed. The patient's tolerance of                            previous anesthesia was also reviewed. The risks                            and benefits of the procedure and the sedation                            options and risks were discussed with the patient.                            All questions were answered, and informed consent                            was obtained. Prior Anticoagulants: The patient has                            taken no previous anticoagulant or antiplatelet                            agents. ASA Grade Assessment: II - A patient with                            mild systemic disease. After reviewing the risks                            and benefits, the patient was deemed in                            satisfactory condition to undergo the procedure.                           After obtaining informed consent, the colonoscope  was passed under direct vision. Throughout the                            procedure, the patient's blood pressure, pulse, and                            oxygen saturations were monitored continuously. The                            Colonoscope was introduced through the anus and                            advanced to the the terminal ileum. The colonoscopy                            was  performed without difficulty. The patient                            tolerated the procedure well. The quality of the                            bowel preparation was adequate. The terminal ileum,                            ileocecal valve, appendiceal orifice, and rectum                            were photographed. Scope In: 1:44:35 PM Scope Out: 1:56:20 PM Scope Withdrawal Time: 0 hours 8 minutes 34 seconds  Total Procedure Duration: 0 hours 11 minutes 45 seconds  Findings:                 The perianal and digital rectal examinations were                            normal.                           A few small-mouthed diverticula were found in the                            sigmoid colon.                           The exam was otherwise normal throughout the                            remainder of the colon. There was no active                            bleeding nor stigmata of recent bleeding noted.                           Retroflexion in the right colon was performed.  The retroflexed view of the distal rectum and anal                            verge was normal and showed no anal or rectal                            abnormalities.                           The terminal ileum appeared normal. Complications:            No immediate complications. Estimated Blood Loss:     Estimated blood loss: none. Impression:               - Diverticulosis in the sigmoid colon.                           - The distal rectum and anal verge are normal on                            retroflexion view.                           - The examined portion of the ileum was normal.                           - No specimens collected. Recommendation:           - Patient has a contact number available for                            emergencies. The signs and symptoms of potential                            delayed complications were discussed with the                             patient. Return to normal activities tomorrow.                            Written discharge instructions were provided to the                            patient.                           - Soft diet today.                           - Continue present medications.                           - Repeat colonoscopy in 10 years for screening                            purposes.                           -  To visualize the small bowel, perform video                            capsule endoscopy at appointment to be scheduled.                           - Return to GI clinic after studies are complete. Doristine LocksVito Dayvian Blixt, MD 02/13/2019 2:07:55 PM

## 2019-02-13 NOTE — Op Note (Signed)
[facility] Patient Name: Sydney Sandoval Procedure Date: 02/13/2019 1:21 PM MRN: [id number] Endoscopist: Doristine LocksVito Pawan Knechtel , MD Age: 48 Referring MD:  Date of Birth: 10/29/1970 Gender: Female Account #: [id number] Procedure:                Upper GI endoscopy Indications:              Iron deficiency anemia without overt GI blood loss,                            Intermittent dysphagia to pills, pointing to                            anterior neck Medicines:                Monitored Anesthesia Care Procedure:                Pre-Anesthesia Assessment:                           - Prior to the procedure, a History and Physical                            was performed, and patient medications and                            allergies were reviewed. The patient's tolerance of                            previous anesthesia was also reviewed. The risks                            and benefits of the procedure and the sedation                            options and risks were discussed with the patient.                            All questions were answered, and informed consent                            was obtained. Prior Anticoagulants: The patient has                            taken no previous anticoagulant or antiplatelet                            agents. ASA Grade Assessment: II - A patient with                            mild systemic disease. After reviewing the risks                            and benefits, the patient was deemed in  satisfactory condition to undergo the procedure.                           After obtaining informed consent, the endoscope was                            passed under direct vision. Throughout the                            procedure, the patient's blood pressure, pulse, and                            oxygen saturations were monitored continuously. The                            Model GIF-HQ190 [id number]) scope was  introduced                            through the mouth, and advanced to the second part                            of duodenum. The upper GI endoscopy was                            accomplished without difficulty. The patient                            tolerated the procedure well. Scope In: Scope Out: Findings:                 The examined esophagus was normal. There was a                            small mucosal rent noted at 15 cm from the incisors                            from passage of the endoscope alone, consistent                            with dilation near/at the UES. The dilation site                            was examined and showed mild mucosal disruption.                            Estimated blood loss was minimal. Biopsies were                            then taken from the distal and proximal esophagus                            with a cold forceps for histology and evaluation  for Eosinophilic Esophagitis (EoE).                           The Z-line was regular and was found 38 cm from the                            incisors.                           The gastroesophageal flap valve was visualized                            endoscopically and classified as Hill Grade III                            (minimal fold, loose to endoscope, hiatal hernia                            likely).                           Scattered minimal inflammation characterized by                            erythema was found in the gastric body. Biopsies                            were taken with a cold forceps for Helicobacter                            pylori testing. Estimated blood loss was minimal.                           The incisura, gastric antrum and pylorus were                            normal.                           The duodenal bulb, first portion of the duodenum                            and second portion of the duodenum were normal.                             Biopsies for histology were taken with a cold                            forceps for evaluation of celiac disease. Estimated                            blood loss was minimal. Complications:            No immediate complications. Estimated Blood Loss:     Estimated blood loss was minimal. Impression:               -  Normal esophagus. Biopsied.                           - Z-line regular, 38 cm from the incisors.                           - Gastroesophageal flap valve classified as Hill                            Grade III (minimal fold, loose to endoscope, hiatal                            hernia likely).                           - Gastritis. Biopsied.                           - Normal incisura, antrum and pylorus.                           - Normal duodenal bulb, first portion of the                            duodenum and second portion of the duodenum.                            Biopsied.                           - No active bleeding nor stigmata of bleeding noted                            on this study. Recommendation:           - Patient has a contact number available for                            emergencies. The signs and symptoms of potential                            delayed complications were discussed with the                            patient. Return to normal activities tomorrow.                            Written discharge instructions were provided to the                            patient.                           - Soft diet today.                           - Continue present medications.                           -  Await pathology results.                           - Repeat upper endoscopy PRN for retreatment.                           - To visualize the small bowel, perform video                            capsule endoscopy at appointment to be scheduled.                           - Return to GI clinic after studies are complete. Doristine LocksVito  Kalyn Dimattia, MD 02/13/2019 2:03:34 PM

## 2019-02-13 NOTE — Patient Instructions (Signed)
Handouts given for soft diet, gastritis, and diverticulosis.  YOU HAD AN ENDOSCOPIC PROCEDURE TODAY AT [facility]:   Refer to the procedure report that was given to you for any specific questions about what was found during the examination.  If the procedure report does not answer your questions, please call your gastroenterologist to clarify.  If you requested that your care partner not be given the details of your procedure findings, then the procedure report has been included in a sealed envelope for you to review at your convenience later.  YOU SHOULD EXPECT: Some feelings of bloating in the abdomen. Passage of more gas than usual.  Walking can help get rid of the air that was put into your GI tract during the procedure and reduce the bloating. If you had a lower endoscopy (such as a colonoscopy or flexible sigmoidoscopy) you may notice spotting of blood in your stool or on the toilet paper. If you underwent a bowel prep for your procedure, you may not have a normal bowel movement for a few days.  Please Note:  You might notice some irritation and congestion in your nose or some drainage.  This is from the oxygen used during your procedure.  There is no need for concern and it should clear up in a day or so.  SYMPTOMS TO REPORT IMMEDIATELY:   Following lower endoscopy (colonoscopy or flexible sigmoidoscopy):  Excessive amounts of blood in the stool  Significant tenderness or worsening of abdominal pains  Swelling of the abdomen that is new, acute  Fever of 100F or higher   Following upper endoscopy (EGD)  Vomiting of blood or coffee ground material  New chest pain or pain under the shoulder blades  Painful or persistently difficult swallowing  New shortness of breath  Fever of 100F or higher  Black, tarry-looking stools  For urgent or emergent issues, a gastroenterologist can be reached at any hour by calling [phone].   DIET:  We do recommend a small meal  at first, but then you may proceed to your regular diet.  Drink plenty of fluids but you should avoid alcoholic beverages for 24 hours.  ACTIVITY:  You should plan to take it easy for the rest of today and you should NOT DRIVE or use heavy machinery until tomorrow (because of the sedation medicines used during the test).    FOLLOW UP: Our staff will call the number listed on your records 48-72 hours following your procedure to check on you and address any questions or concerns that you may have regarding the information given to you following your procedure. If we do not reach you, we will leave a message.  We will attempt to reach you two times.  During this call, we will ask if you have developed any symptoms of COVID 19. If you develop any symptoms (ie: fever, flu-like symptoms, shortness of breath, cough etc.) before then, please call [phone].  If you test positive for Covid 19 in the 2 weeks post procedure, please call and report this information to [place].    If any biopsies were taken you will be contacted by phone or by letter within the next 1-3 weeks.  Please call us at [phone] if you have not heard about the biopsies in 3 weeks.    SIGNATURES/CONFIDENTIALITY: You and/or your care partner have signed paperwork which will be entered into your electronic medical record.  These signatures attest to the fact that that the information above on  your After Visit Summary has been reviewed and is understood.  Full responsibility of the confidentiality of this discharge information lies with you and/or your care-partner. 

## 2019-02-13 NOTE — Progress Notes (Signed)
Pt's states no medical or surgical changes since previsit or office visit. 

## 2019-02-13 NOTE — Progress Notes (Signed)
Sydney Sandoval- [name]- vitals 

## 2019-02-15 ENCOUNTER — Telehealth: Payer: Self-pay

## 2019-02-15 ENCOUNTER — Telehealth: Payer: Self-pay | Admitting: *Deleted

## 2019-02-15 NOTE — Telephone Encounter (Signed)
First follow up call attempt.  Reached voicemail with phone number identified.  Message left to call if any questions/concerns regarding procedures or development of COVID symptoms.

## 2019-02-15 NOTE — Telephone Encounter (Signed)
Attempted to reach patient for post-procedure f/u call. No answer. Unable to leave message as mailbox is full. 

## 2019-02-28 ENCOUNTER — Telehealth: Payer: Self-pay

## 2019-02-28 NOTE — Telephone Encounter (Signed)
[facility], Irwin Brakeman spoke with the patient concerning the need for her to get the 2nd Hep B vaccine that was ordered in 11/2018; patient verbalized understanding to go to the pharmacy and receive vaccine;   Called and spoke with pharmacist at [facility] verified RX and medication in stock, also stated she would call the patient to ensure patient is aware of medication being ready for injection;

## 2019-02-28 NOTE — Telephone Encounter (Signed)
-----   Message from Marlon Pel, RN sent at 01/17/2019  2:29 PM EDT -----  ----- Message ----- From: Dalene Seltzer, RN Sent: 01/03/2019 To: Mohammed Kindle, RN   ----- Message ----- From: Marlon Pel, RN Sent: 12/30/2018 To: Mohammed Kindle, RN  Remind patient to go to pharmacy for 2nd Hep B shot or set uip in the  office if Covid restrictions were released.  See results notes 11/29/18 Cirigliano

## 2019-03-01 ENCOUNTER — Encounter: Payer: Self-pay | Admitting: Gastroenterology

## 2019-03-02 ENCOUNTER — Telehealth: Payer: Self-pay

## 2019-03-02 ENCOUNTER — Other Ambulatory Visit: Payer: Self-pay

## 2019-03-02 DIAGNOSIS — K2970 Gastritis, unspecified, without bleeding: Secondary | ICD-10-CM

## 2019-03-02 DIAGNOSIS — D509 Iron deficiency anemia, unspecified: Secondary | ICD-10-CM

## 2019-03-02 DIAGNOSIS — K5730 Diverticulosis of large intestine without perforation or abscess without bleeding: Secondary | ICD-10-CM

## 2019-03-02 NOTE — Telephone Encounter (Signed)
-----   Message from Algernon Huxley, RN sent at 02/28/2019  3:20 PM EDT ----- Regarding: FW: Capsule endo  ----- Message ----- From: Lavena Bullion, DO Sent: 02/24/2019   6:51 AM EDT To: Algernon Huxley, RN Subject: RE: Capsule endo                               Yes, that is ok with me, thanks. ----- Message ----- From: Algernon Huxley, RN Sent: 02/23/2019  12:03 PM EDT To: Lavena Bullion, DO Subject: Capsule endo                                   Not sure this went through if I am duplicating  Just wanted to see if you were ok with this pt having capsule endo done at hospital as she has no insurance.  Please advise.

## 2019-03-02 NOTE — Telephone Encounter (Signed)
The patient has been scheduled for a capsule endoscopy at [facility] on 03/23/2019; the instructions have been placed in a communication (letter); Can you please translate these instructions to the patient verbally or in the letter and then print and mail to her please; thank you

## 2019-03-02 NOTE — Telephone Encounter (Signed)
Patient is needing a capsule to be done at the hospital as the patient does not have insurance;

## 2019-03-13 NOTE — Telephone Encounter (Signed)
Were you able to get in touch with this patient?

## 2019-03-14 ENCOUNTER — Encounter: Payer: Self-pay | Admitting: Gastroenterology

## 2019-03-14 ENCOUNTER — Telehealth: Payer: Self-pay | Admitting: Gastroenterology

## 2019-03-14 ENCOUNTER — Other Ambulatory Visit: Payer: Self-pay

## 2019-03-14 ENCOUNTER — Telehealth (INDEPENDENT_AMBULATORY_CARE_PROVIDER_SITE_OTHER): Payer: Self-pay | Admitting: Gastroenterology

## 2019-03-14 VITALS — Ht 60.0 in | Wt 144.0 lb

## 2019-03-14 DIAGNOSIS — K2970 Gastritis, unspecified, without bleeding: Secondary | ICD-10-CM

## 2019-03-14 DIAGNOSIS — R740 Nonspecific elevation of levels of transaminase and lactic acid dehydrogenase [LDH]: Secondary | ICD-10-CM

## 2019-03-14 DIAGNOSIS — R7401 Elevation of levels of liver transaminase levels: Secondary | ICD-10-CM

## 2019-03-14 DIAGNOSIS — D509 Iron deficiency anemia, unspecified: Secondary | ICD-10-CM

## 2019-03-14 DIAGNOSIS — E559 Vitamin D deficiency, unspecified: Secondary | ICD-10-CM

## 2019-03-14 DIAGNOSIS — N920 Excessive and frequent menstruation with regular cycle: Secondary | ICD-10-CM

## 2019-03-14 DIAGNOSIS — K3189 Other diseases of stomach and duodenum: Secondary | ICD-10-CM

## 2019-03-14 DIAGNOSIS — K716 Toxic liver disease with hepatitis, not elsewhere classified: Secondary | ICD-10-CM

## 2019-03-14 DIAGNOSIS — K31A0 Gastric intestinal metaplasia, unspecified: Secondary | ICD-10-CM

## 2019-03-14 NOTE — Progress Notes (Signed)
Chief Complaint: Elevated LAEs, IDA, GIM  Referring Provider:     Brantley Stage A, PA-C  GI Hx: 1) Elevated LAE's: Hx of cholelithiasis and CDL in 03/2018, s/p lap ccy with + IOC requiring ERCP. Recent labs from 09/2018 n/f AST/ALT/ALP 270/517/144 with normal TBili 0.7.  Labs in 10/2018: Ferritin 3, iron saturation 3%, iron 13.  Received IV iron infusion x2 in April.   No abdominal pain, n/v/d/c, and no hx of jaundice, icteric sclera, GIB or confusion/HE.  Aside from cholelithiasis, no known personal or family history of liver disease.  Aside from cholelithiasis, imaging studies on admission in 03/2018 notable for normal RUQ [place] and mild steatosis on MRCP.  Otherwise no masses. AST/ALT/ALP 878/283/174 and normal TBili at the time of hospital d/c.  She has been taking MTX 10 mg weekly and Prednisone 7.5 mg x4 years for RA.  Recommended liver biopsy following COVID-19 restrictions. She has since stopped the MTX on her own in March d/t elevated LAEs.   Extended serologic work-up unrevealing to include immunoglobulin panel (IgA mildly elevated at 474), TTG, LKM viral hep panel, ASMA, ANA, AMA, A1 AT, INR/PT, iron panel.  Given Rx for HBV vaccine series  2) IDA: Labs in 09/2018 n/f microcytic anemia with H/H 8.8/31.5 and MCV/RDW 65/18.6. Normal BMP. H/H on admission in 03/2018 8.3/28.6 with MCV 59. Ferritin 6, iron 21, iron sat 5%. Normal B12/folate. Transfused 1U for Hgb 6.6. No other history blood transfusions. No prior CBC for comparison but she reports she has had IDA for years. No hx of hematochezia, melena, or hematemesis.  Not avoiding foods and has diet rich in iron-containing foods.  No known family history of Celiac Disease. Takes iron tablet daily for years.  EGD/colonoscopy completed 01/2019 and essentially unrevealing, with recommendation to proceed with VCE for small bowel interrogation.  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  Negative  for H. Pylori. No FHx of stomach CA. No hx of H pylori. Not a smoker.   4) Vitamin D insufficiency: -Vitamin D 27.6 in 10/2018.  Started on ergocalciferol.  Endoscopic history: -EGD (01/2019, Dr. Bryan Lemma): Small mucosal rent at 15 cm from incisors with passage of endoscope alone, consistent with dilation near/at the UES.  Otherwise normal esophagus with biopsies negative for EOE.  Hill grade 3 valve and mild non-H. pylori gastritis.  Biopsies with gastric intestinal metaplasia without dysplasia.  Normal duodenum with normal biopsies. -Colonoscopy (01/2019, Dr. Bryan Lemma): Mild sigmoid diverticulosis, otherwise normal.  Normal TI.  Repeat in 10 years.   HPI:    Due to current restrictions/limitations of in-office visits due to the COVID-19 pandemic, this scheduled clinical appointment was converted to a telehealth consultation via telephone.  -Time of medical discussion: 26 minutes -The patient did consent to this telephone visit and is aware of possible charges through their insurance for this visit.  -Names of all parties present: Sydney Sandoval (patient), Gerrit Heck, DO, [facility] (physician)  Sydney Sandoval is a 48 y.o. female with elevated liver enzymes, IDA, GIM, vitamin D insufficiency presenting to the Gastroenterology Clinic for routine follow-up.  Initially seen by me in 10/2018, with EGD/colonoscopy completed last month as above.  Today states she o/w feels well. States she had heavy menstrual period this month. Has had a hx of heavy bleeding x3-4 days/month in the past. Does not follow with Gyn.   No issues w/ iron infusion in April.  Past medical history, past surgical history, social history, family history, medications, and allergies reviewed in the chart and with patient over the phone.  Past Medical History: -Cholelithiasis now s/p ccy and ERCP in 03/2018 - Rheumatoid arthritis -Iron deficiency anemia -History of LTBI-completed rifampin in 2017  Past Surgical History:   Procedure Laterality Date  . CESAREAN SECTION     2 c-sections  . CHOLECYSTECTOMY N/A 04/14/2018   Procedure: LAPAROSCOPIC CHOLECYSTECTOMY WITH INTRAOPERATIVE CHOLANGIOGRAM;  Surgeon: Berna Bueonnor, Chelsea A, MD;  Location: [facility];  Service: General;  Laterality: N/A;  . ERCP N/A 04/15/2018   Procedure: ENDOSCOPIC RETROGRADE CHOLANGIOPANCREATOGRAPHY (ERCP);  Surgeon: Jeani HawkingHung, Patrick, MD;  [facility];  Service: Endoscopy;  Laterality: N/A;  . REMOVAL OF STONES  04/15/2018   Procedure: REMOVAL OF STONES;  Surgeon: Jeani HawkingHung, Patrick, MD;  Location: [facility];  Service: Endoscopy;;  . Dennison MascotSPHINCTEROTOMY  04/15/2018   Procedure: Dennison MascotSPHINCTEROTOMY;  Surgeon: Jeani HawkingHung, Patrick, MD;  Location: [facility];  Service: Endoscopy;;   Family History  Problem Relation Age of Onset  . Colon cancer Neg Hx    Social History   Tobacco Use  . Smoking status: Never Smoker  . Smokeless tobacco: Never Used  Substance Use Topics  . Alcohol use: Yes    Comment: occ  . Drug use: Never   Current Outpatient Medications  Medication Sig Dispense Refill  . ferrous sulfate 325 (65 FE) MG EC tablet Take 1 tablet (325 mg total) by mouth 3 (three) times daily with meals. (Patient taking differently: Take 325 mg by mouth daily. )  3  . predniSONE (DELTASONE) 2.5 MG tablet Take 7.5 mg by mouth daily.  1  . hepatitis B vaccine (ENGERIX-B) 20 MCG/ML injection Inject 1 ml IM on day 0, day 30, and then 6 months 1 mL 2  . methotrexate (RHEUMATREX) 2.5 MG tablet Take 10 mg by mouth once a week. Take every Monday    . oxyCODONE (OXY IR/ROXICODONE) 5 MG immediate release tablet Take 1 tablet (5 mg total) by mouth every 6 (six) hours as needed for moderate pain or severe pain. (Patient not taking: Reported on 11/21/2018) 30 tablet 0   No current facility-administered medications for this visit.    No Known Allergies   Review of Systems: All systems reviewed and negative except where noted in HPI.     Physical Exam:     Physical exam not completed due to the nature of this telehealth communication.  Patient was otherwise alert and oriented and well communicative.   ASSESSMENT AND PLAN;   1) Iron Deficiency Anemia: -Recheck CBC and iron panel -VCE for small bowel interrogation -GYN referral as below  2) Elevated Liver Enzymes: -Suspect secondary to methotrexate.  She has since stopped methotrexate in 10/2018 for fear that this was causing liver damage. -Repeat liver enzymes now.  If downtrending, can continue observation for now.  If stable or uptrending, low threshold to then proceed with liver biopsy -Extended serologic work-up otherwise largely unrevealing (mildly elevated IgA, otherwise all normal) - Continue serial enzyme checks as above  3) Menorrhagia: -Referral to GYN  4) Gastric Intestinal Metaplasia: Gastric intestinal metaplasia incidentally noted on recent biopsies.  While intestinal metaplasia is a described precursor for gastric cancer, per current societal guidelines, the decision to proceed with repeat endoscopy with gastric mapping is typically reserved for those at increased risk for progression to gastric adenocarcinoma.  Those at increased risk include: - Family history of 1st degree relative with gastric cancer  -  Non-Caucasian race/ethnicity, including African-Americans, Hispanics, and Asians - First-generation immigrants from regions of high incidence - Chronic or recurrent H. pylori infection  - In those at high risk, societal guidelines recommend repeat endoscopy with gastric mapping within 1 (AGA guideline) to 3 (ESGE guideline) years of incident diagnosis to establish subtype and extent of disease.  Given her ethnicity, we elected to proceed with repeat EGD with GIM mapping in 2-3 years. - In those at increased risk the presence of IM noted, surveillance can be considered every 3 (AGA) to 5 (ESGE) years, and stopped after 2 consecutive mapping studies demonstrate metaplasia  without dysplasia (ASGE guideline)  5) Vitamin D insufficiency: -Completed ergocalciferol.  Resuming vitamin D 2000 IU/day -repeat vitamin D check in another 3 to 6 months  The indications, risks, and benefits of Video Capsule Endoscopy were explained to the patient in detail. Risks include but are not limited to capsule retention requiring endoscopic or surgical retrieval (occurs in 1-2%), failure to reach cecum prior to capsule battery expiration (ie, incomplete study), or inadequate visualization (ie, non-diagnostic study). The patient verbalized understanding and wished to proceed. All questions answered, referred to scheduler. Further recommendations pending results of the exam.   I spent over 25 minutes of time with the patient. Greater than 50% of the time was spent counseling and coordinating care.     Verlin DikeVito V Abhimanyu Cruces, DO, FACG  03/14/2019, 3:12 PM   Lonergan, Malia A, PA-C

## 2019-03-14 NOTE — Patient Instructions (Addendum)
If you are age 46 or older, your body mass index should be between 23-30. Your Body mass index is 28.12 kg/m. If this is out of the aforementioned range listed, please consider follow up with your Primary Care Provider.  If you are age 41 or younger, your body mass index should be between 19-25. Your Body mass index is 28.12 kg/m. If this is out of the aformentioned range listed, please consider follow up with your Primary Care Provider.   To help prevent the possible spread of infection to our patients, communities, and staff; we will be implementing the following measures:  As of now we are not allowing any visitors/family members to accompany you to any upcoming appointments with [facility]. If you have any concerns about this please contact our office to discuss prior to the appointment.   Your provider has requested that you go to the basement level for lab work at our [place] location ([address]. [place] [address]) . Press "B" on the elevator. The lab is located at the first door on the left as you exit the elevator. You may go at whatever time is convienent for you. The current hours of operations are Monday- Friday 7:30am-4:30pm.  A referral has been sent for  [facility] [place] at [facility] [place],  [place]  [address] [phone]  Please call our office at [phone] to set up your 3-6 month follow up visit.   It was a pleasure to see you today!  Vito Cirigliano, D.O.

## 2019-03-14 NOTE — Telephone Encounter (Signed)
Can you translate the letter that is pending for this patient and then mail it to the patient? Thanks in advance-

## 2019-03-22 ENCOUNTER — Telehealth: Payer: Self-pay | Admitting: Gastroenterology

## 2019-03-22 NOTE — Telephone Encounter (Signed)
Pt is scheduled for hosp procedure tomorrow and has questions regarding prep.

## 2019-03-23 ENCOUNTER — Encounter: Payer: Self-pay | Admitting: Gastroenterology

## 2019-03-23 ENCOUNTER — Encounter (HOSPITAL_COMMUNITY)
Admission: RE | Disposition: A | Discharge status: Home / Self Care | Payer: Self-pay | Source: Home / Self Care | Attending: Gastroenterology

## 2019-03-23 ENCOUNTER — Ambulatory Visit (HOSPITAL_COMMUNITY)
Admission: RE | Admit: 2019-03-23 | Discharge: 2019-03-23 | Disposition: A | Discharge status: Home / Self Care | Payer: Self-pay | Attending: Gastroenterology | Admitting: Gastroenterology

## 2019-03-23 ENCOUNTER — Encounter: Payer: Self-pay | Admitting: Family Medicine

## 2019-03-23 DIAGNOSIS — K3189 Other diseases of stomach and duodenum: Secondary | ICD-10-CM | POA: Insufficient documentation

## 2019-03-23 DIAGNOSIS — D509 Iron deficiency anemia, unspecified: Secondary | ICD-10-CM

## 2019-03-23 HISTORY — PX: GIVENS CAPSULE STUDY: SHX5432

## 2019-03-23 SURGERY — IMAGING PROCEDURE, GI TRACT, INTRALUMINAL, VIA CAPSULE

## 2019-03-23 NOTE — Progress Notes (Signed)
Pt swallowed pill at 915 am with coaching from Endoscopy staff. Pt given instructions on times that she can eat. Pt verbalizes understanding of being NPO for first two hours followed by clear liquids at 1115, a light snack at 1315, and regular diet at 1715. Pt given a bag to bring back leads tommorow.

## 2019-03-24 ENCOUNTER — Encounter (HOSPITAL_COMMUNITY): Payer: Self-pay | Admitting: Gastroenterology

## 2019-04-13 NOTE — Telephone Encounter (Signed)
Dr. Loletha Grayer, Do you want the patient to be seen prior to the repeat lab work or after the lab work has been completed in 3 months? Please advise

## 2019-04-13 NOTE — Telephone Encounter (Signed)
After.  Thank you.

## 2019-04-13 NOTE — Telephone Encounter (Signed)
Spoke to Sydney Sandoval and related results in [name]. Had no questions or concerns. Scheduled her for the next available office visit on 04-24-2019 at 1:40pm. Advised her to call back and ask for me if she had any questions later.

## 2019-04-13 NOTE — Telephone Encounter (Signed)
Can you please assist in this request from Dr. Bryan Lemma? Thank you

## 2019-04-13 NOTE — Telephone Encounter (Signed)
-----   Message from Lavena Bullion, DO sent at 04/13/2019  8:32 AM EDT ----- Can you please call this patient to inform her that her video capsule endoscopy images have been reviewed and notable for the following:- Complete capsule study with adequate prep -Negative study without evidence of bleeding or stigmata of recent bleed  Recommendations: -Follow-up in the GI clinic -Repeat hemoglobin in 3 months

## 2019-04-13 NOTE — Procedures (Signed)
VCE completed 7/30. Images retrieved and read and notable for the following:  -First duodenal image at 0:40:43 -ICV at 05:08:37 -First cecal image at 05:09:05  - Complete capsule study with adequate prep -Negative study without evidence of bleeding or stigmata of recent bleed -Full report with images scanned into EMR under procedure tab  Recommendations: -Follow-up in the GI clinic -Repeat hemoglobin in 3 months

## 2019-04-14 NOTE — Telephone Encounter (Signed)
Called and spoke to Ms Chaviano and explained in [place] that she does not need the upcoming appointment 04-24-2019. It should be in 3 months. Advised her that I would put a recall in the system and reach out to her when the calendar for November is available. I also advised her that if she has any GI issues and needs to be seen sooner  she is welcome to contact our office and we will be happy to see her. Verbalized understanding and has no further questions at this time.

## 2019-04-14 NOTE — Telephone Encounter (Signed)
Please reschedule this patient for 3 months from now-after she has completed the requested lab work; Thank you

## 2019-04-24 ENCOUNTER — Ambulatory Visit: Payer: Self-pay | Admitting: Gastroenterology

## 2019-05-09 ENCOUNTER — Encounter: Payer: Self-pay | Admitting: Obstetrics & Gynecology

## 2019-07-17 ENCOUNTER — Ambulatory Visit: Payer: Self-pay | Admitting: Gastroenterology

## 2019-07-31 ENCOUNTER — Ambulatory Visit: Payer: Self-pay | Admitting: Gastroenterology

## 2019-08-28 ENCOUNTER — Ambulatory Visit: Payer: Self-pay | Admitting: Gastroenterology

## 2019-09-06 ENCOUNTER — Ambulatory Visit: Payer: Self-pay | Admitting: Gastroenterology

## 2019-09-23 IMAGING — RF DG CHOLANGIOGRAM OPERATIVE
1 series · 4 of 4 positions shown · non-contrast
Comparison: 04/13/2018

CLINICAL DATA: Cholelithiasis

EXAM:
INTRAOPERATIVE CHOLANGIOGRAM
TECHNIQUE: Cholangiographic images from the C-arm fluoroscopic device were
submitted for interpretation post-operatively. Please see the
procedural report for the amount of contrast and the fluoroscopy
time utilized.

[Series 1: run · 4 of 55 frames shown]
[frame 7/55]
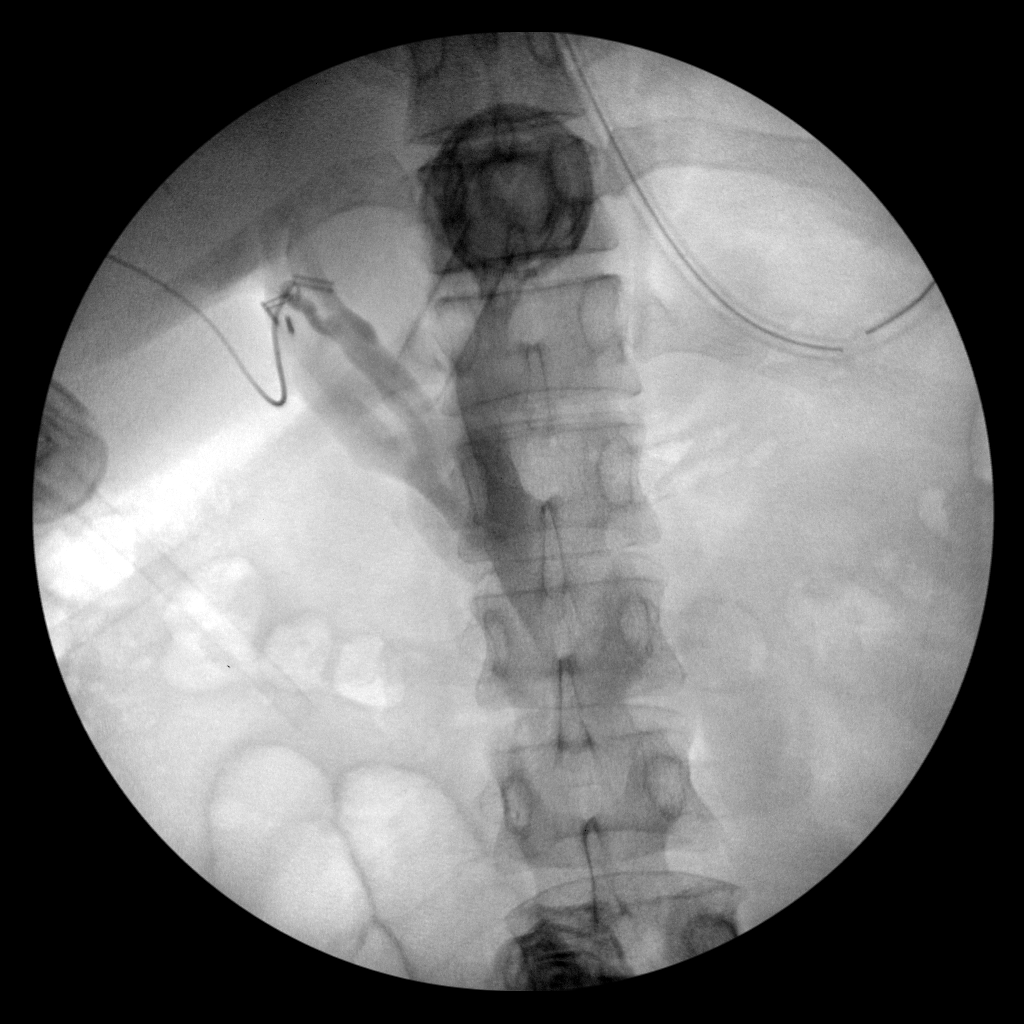
[frame 9/55]
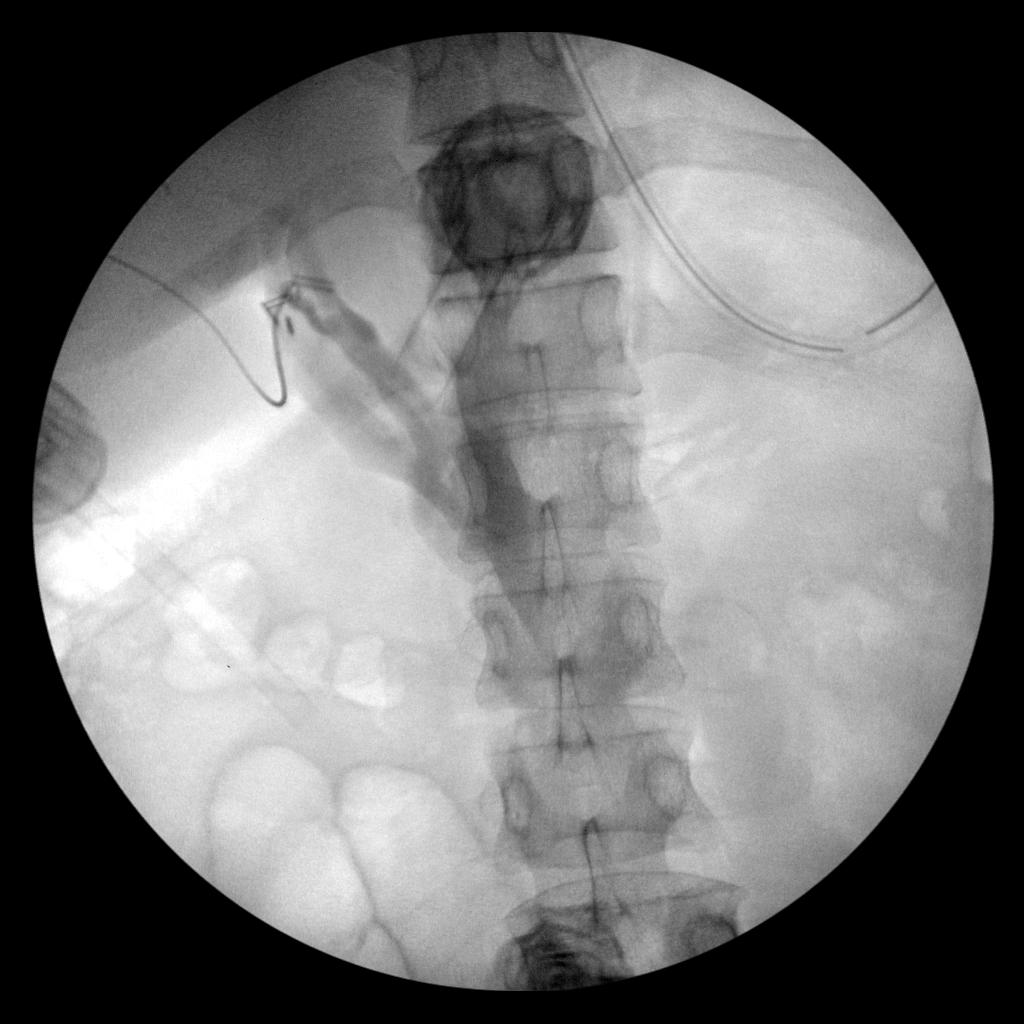
[frame 28/55]
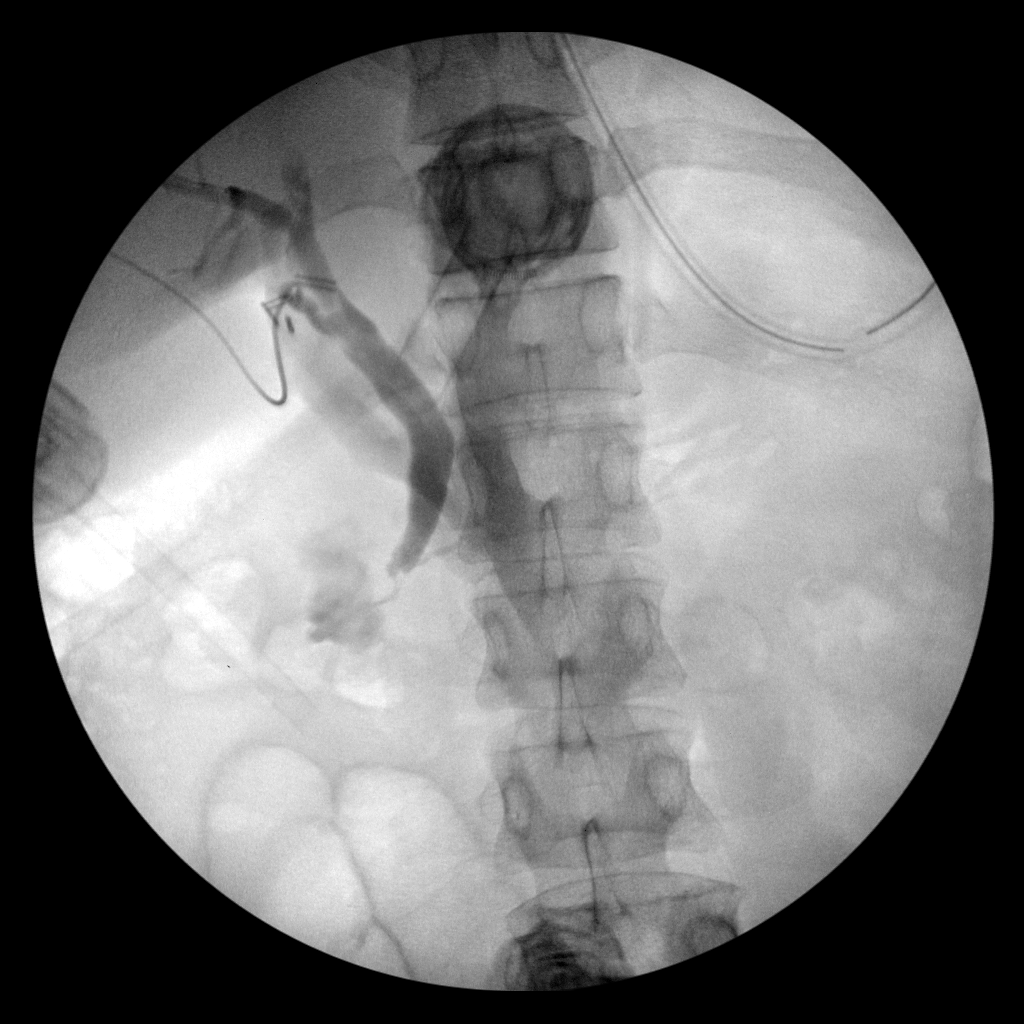
[frame 47/55]
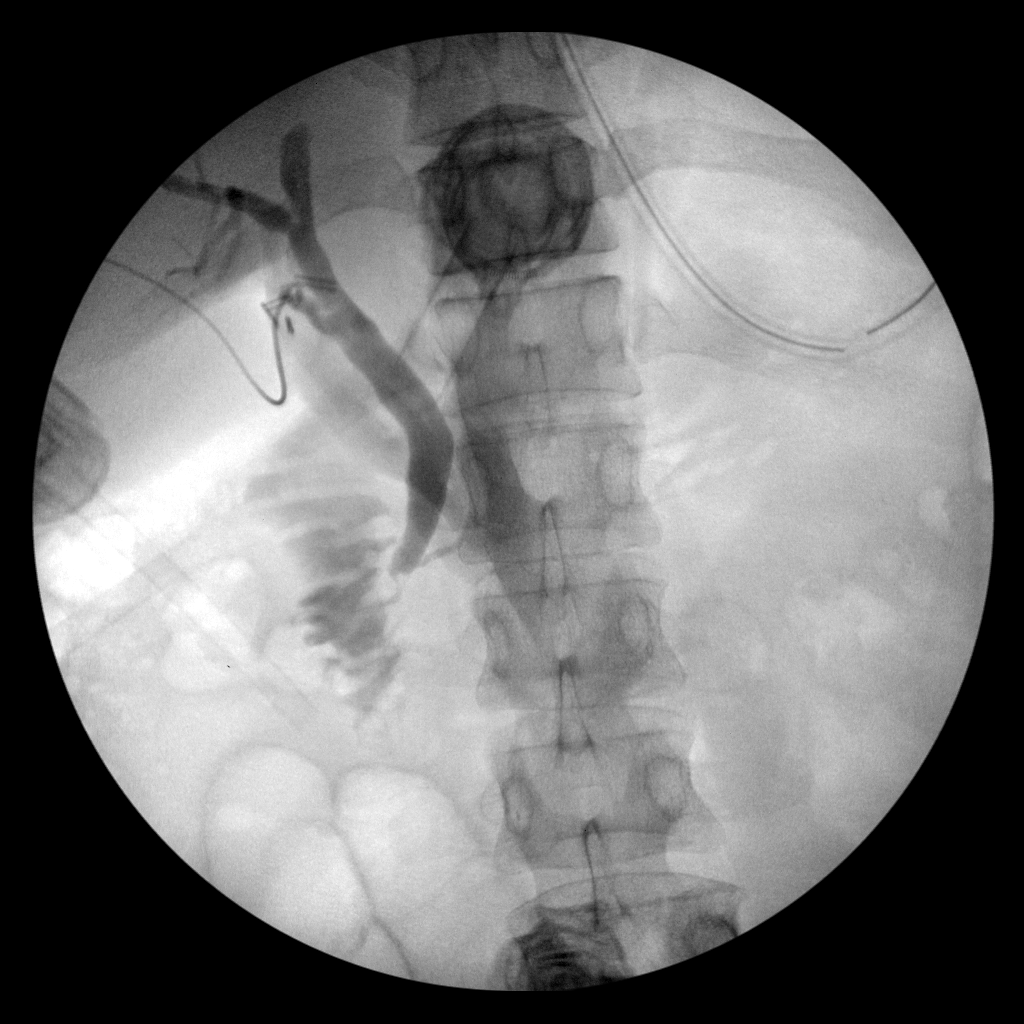

[4 of 4 positions shown; findings below may reference images not displayed]

FINDINGS: Intraoperative cholangiogram performed during the laparoscopic
procedure. The residual cystic duct, biliary confluence, common
hepatic duct, and common bile duct are patent. Contrast does drain
into the duodenum. Small triangular distal CBD persistent filling
defect may represent nonobstructing choledocholithiasis.
IMPRESSION: Patent biliary system.

Small persistent triangular distal CBD filling defect at the ampulla
may represent nonobstructing choledocholithiasis.

## 2019-10-13 ENCOUNTER — Ambulatory Visit: Payer: Self-pay | Admitting: Gastroenterology

## 2019-10-13 ENCOUNTER — Encounter: Payer: Self-pay | Admitting: Gastroenterology

## 2019-10-13 ENCOUNTER — Other Ambulatory Visit: Payer: Self-pay

## 2019-10-13 VITALS — BP 104/74 | HR 66 | Temp 98.2°F | Ht 60.0 in | Wt 151.5 lb

## 2019-10-13 DIAGNOSIS — E559 Vitamin D deficiency, unspecified: Secondary | ICD-10-CM

## 2019-10-13 DIAGNOSIS — K716 Toxic liver disease with hepatitis, not elsewhere classified: Secondary | ICD-10-CM

## 2019-10-13 DIAGNOSIS — K3189 Other diseases of stomach and duodenum: Secondary | ICD-10-CM

## 2019-10-13 DIAGNOSIS — N920 Excessive and frequent menstruation with regular cycle: Secondary | ICD-10-CM

## 2019-10-13 DIAGNOSIS — D509 Iron deficiency anemia, unspecified: Secondary | ICD-10-CM

## 2019-10-13 DIAGNOSIS — K31A0 Gastric intestinal metaplasia, unspecified: Secondary | ICD-10-CM

## 2019-10-13 DIAGNOSIS — R7401 Elevation of levels of liver transaminase levels: Secondary | ICD-10-CM

## 2019-10-13 NOTE — Patient Instructions (Signed)
Your provider has requested that you go to the basement level for lab work at [address] [place] in [place] [address]. Press "B" on the elevator. The lab is located at the first door on the left as you exit the elevator.   return in 12 months  It was a pleasure to see you today!  Vito Cirigliano, D.O.

## 2019-10-13 NOTE — Progress Notes (Signed)
P  Chief Complaint:    Iron deficiency anemia  GI History: 49 year old female follows in the GI clinic for history of elevated liver enzymes, IDA, GIM, vitamin D deficiency.  Last seen in the GI clinic in 02/2019. 1) Elevated LAE's: Hx of cholelithiasis and CDL in 09/7033, s/p lap ccy with + IOC requiring ERCP.  - Labs from 09/2018 n/f AST/ALT/ALP 270/517/144 with normal TBili 0.7.    No abdominal pain, n/v/d/c, and no hx of jaundice, icteric sclera, GIB or confusion/HE.Aside from cholelithiasis, no known personal or family history of liver disease. Aside from cholelithiasis, imagingstudieson admission in 03/2018 notable fornormal RUQ [place] and mildsteatosisonMRCP. Otherwiseno masses. AST/ALT/ALP 878/283/174 and normal TBili at the time of hospital d/c.  She had been taking MTX 10 mg weekly and Prednisone 7.5 mg x4 years for RA.    Stopped MTX 2020 on her own continue elevated liver enzymes.  Recommended liver biopsy following COVID-19 restrictions-not completed.   Extended serologic work-up unrevealing to include immunoglobulin panel (IgA mildly elevated at 474), TTG, LKM viral hep panel, ASMA, ANA, AMA, A1 AT, INR/PT, iron panel.    Completed HBV vaccine series.  2) IDA: -Labs in 09/2018 n/f microcytic anemia with H/H 8.8/31.5 and MCV/RDW 65/18.6. Normal BMP. -Labs in 10/2018: Ferritin 3, iron saturation 3%, iron 13.  Received IV iron infusion x2 in April -No labs since then -H/H on admission in 03/2018 8.3/28.6 with MCV 59. Ferritin 6, iron 21, iron sat 5%. Normal B12/folate. Transfused 1U for Hgb 6.6. -No other history blood transfusions. No prior CBC for comparisonbut she reports she has had IDA for years. No hx of hematochezia, melena, or hematemesis. Not avoiding foods and has diet rich in iron-containing foods. No known family history of Celiac Disease. -Takes iron tablet daily for years.   -EGD/colonoscopy completed 01/2019 and essentially unrevealing, VCE normal in  02/2019.   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  Negative for H. Pylori. No FHx of stomach CA. No hx of H pylori. Not a smoker.   4) Vitamin D insufficiency: -Vitamin D 27.6 in 10/2018.  Started on ergocalciferol.  Endoscopic history: -EGD (01/2019, Dr. Barron Alvine): Small mucosal rent at 15 cm from incisors with passage of endoscope alone, consistent with dilation near/at the UES.  Otherwise normal esophagus with biopsies negative for EOE.  Hill grade 3 valve and mild non-H. pylori gastritis.  Biopsies with gastric intestinal metaplasia without dysplasia.  Normal duodenum with normal biopsies. -Colonoscopy (01/2019, Dr. Barron Alvine): Mild sigmoid diverticulosis, otherwise normal.  Normal TI.  Repeat in 10 years. -VCE (02/2019): Normal   HPI:     Patient is a 49 y.o. female presenting to the Gastroenterology Clinic for follow-up.  Was last seen by me on 03/14/2019 (telehealth visit).  At that time was feeling well.  Was referred for VCE completed in 02/2019 (normal).  No new labs or imaging since then.  Was previously referred to GYN for further evaluation of IDA.  Today, she states she feels well and w/o any complaints today. Had not seen GYN yet.  No overt GI blood loss.  Taking ferrous sulfate daily.  Stopped MTX a year ago.  Still taking prednisone.  History obtained via interpreter.  Review of systems:     No chest pain, no SOB, no fevers, no urinary sx   No past medical history on file.  Patient's surgical history, family medical history, social history, medications and allergies were all reviewed in Epic  Current Outpatient Medications  Medication Sig Dispense Refill  . ferrous sulfate 325 (65 FE) MG EC tablet Take 1 tablet (325 mg total) by mouth 3 (three) times daily with meals. (Patient taking differently: Take 325 mg by mouth daily. )  3  . hepatitis B vaccine (ENGERIX-B) 20 MCG/ML injection Inject 1 ml IM on day 0, day 30, and  then 6 months 1 mL 2  . methotrexate (RHEUMATREX) 2.5 MG tablet (NO LONGER TAKING) Take 10 mg by mouth once a week. Take every Monday    . oxyCODONE (OXY IR/ROXICODONE) 5 MG immediate release tablet Take 1 tablet (5 mg total) by mouth every 6 (six) hours as needed for moderate pain or severe pain. (Patient not taking: Reported on 11/21/2018) 30 tablet 0  . predniSONE (DELTASONE) 2.5 MG tablet Take 7.5 mg by mouth daily.  1   No current facility-administered medications for this visit.    Physical Exam:     There were no vitals taken for this visit.  GENERAL:  Pleasant female in NAD PSYCH: : Cooperative, normal affect EENT:  conjunctiva pink, mucous membranes moist, neck supple without masses CARDIAC:  RRR, no murmur heard, no peripheral edema PULM: Normal respiratory effort, lungs CTA bilaterally, no wheezing ABDOMEN:  Nondistended, soft, nontender. No obvious masses, no hepatomegaly,  normal bowel sounds SKIN:  turgor, no lesions seen Musculoskeletal:  Normal muscle tone, normal strength NEURO: Alert and oriented x 3, no focal neurologic deficits   IMPRESSION and PLAN:    1) IDA - Tolerating PO iron without issue. No overt GI blood loss and evaluation for GI etiology has been unremarkable as above -Repeat CBC and iron panel -GYN referral previously placed, but hadnt scheduled appt. Plan as below  2) Elevated liver enzymes: -Suspect secondary to methotrexate.  Stopped MTX in 10/2018 -Repeat liver enzymes.  If downtrending, can continue observation for now.  If stable or uptrending, low threshold to then proceed with liver biopsy -Extended serologic work-up otherwise largely unrevealing (mildly elevated IgA, otherwise all normal)  3) Menorrhagia: -Previously endorsed menorrhagia.  Previously placed referral to GYN but hadnt scheduled appt. She prefers to wait until repeat labs. If still IDA, will plan for referral to GYN again  4) Gastric Intestinal Metaplasia: GIM incidentally  noted on EGD with biopsies in 10/2018.  Previously discussed these findings with plan for repeat EGD with mapping in 2022-2023 -In those at increased risk the presence of IM noted, surveillance can be considered every 3 (AGA) to 5 (ESGE) years, and stopped after 2 consecutive mapping studies demonstrate metaplasia without dysplasia (ASGE guideline)  5) Vitamin D insufficiency -Completed ergocalciferol -Repeat vitamin D check  RTC in 12 months or sooner as needed    I spent 32 minutes of time, including in depth chart review, independent review of results as outlined above, communicating results with the patient directly, face-to-face time with the patient, coordinating care, and ordering studies and medications as appropriate, and documentation.         Sydney Sandoval ,DO, FACG 10/13/2019, 3:16 PM

## 2021-02-05 ENCOUNTER — Emergency Department (HOSPITAL_COMMUNITY): Payer: Self-pay

## 2021-02-05 ENCOUNTER — Encounter (HOSPITAL_COMMUNITY): Payer: Self-pay | Admitting: Emergency Medicine

## 2021-02-05 ENCOUNTER — Emergency Department (HOSPITAL_COMMUNITY)
Admission: EM | Admit: 2021-02-05 | Discharge: 2021-02-05 | Disposition: A | Discharge status: Home / Self Care | Payer: Self-pay | Attending: Emergency Medicine | Admitting: Emergency Medicine

## 2021-02-05 DIAGNOSIS — N3001 Acute cystitis with hematuria: Secondary | ICD-10-CM | POA: Insufficient documentation

## 2021-02-05 LAB — CBC
HCT: 37.6 % (ref 36.0–46.0)
Hemoglobin: 11.8 g/dL — ABNORMAL LOW (ref 12.0–15.0)
MCH: 25.8 pg — ABNORMAL LOW (ref 26.0–34.0)
MCHC: 31.4 g/dL (ref 30.0–36.0)
MCV: 82.3 fL (ref 80.0–100.0)
Platelets: 411 10*3/uL — ABNORMAL HIGH (ref 150–400)
RBC: 4.57 MIL/uL (ref 3.87–5.11)
RDW: 15.6 % — ABNORMAL HIGH (ref 11.5–15.5)
WBC: 9 10*3/uL (ref 4.0–10.5)
nRBC: 0 % (ref 0.0–0.2)

## 2021-02-05 LAB — URINALYSIS, ROUTINE W REFLEX MICROSCOPIC
Bacteria, UA: NONE SEEN
Bilirubin Urine: NEGATIVE
Glucose, UA: NEGATIVE mg/dL
Ketones, ur: NEGATIVE mg/dL
Nitrite: NEGATIVE
Protein, ur: 100 mg/dL — AB
RBC / HPF: >50 RBC/hpf — ABNORMAL HIGH (ref 0–5)
Specific Gravity, Urine: 1.005 (ref 1.005–1.030)
WBC, UA: >50 WBC/hpf — ABNORMAL HIGH (ref 0–5)
pH: 7 (ref 5.0–8.0)

## 2021-02-05 LAB — COMPREHENSIVE METABOLIC PANEL
ALT: 52 U/L — ABNORMAL HIGH (ref 0–44)
AST: 31 U/L (ref 15–41)
Albumin: 3.8 g/dL (ref 3.5–5.0)
Alkaline Phosphatase: 76 U/L (ref 38–126)
Anion gap: 8 (ref 5–15)
BUN: 14 mg/dL (ref 6–20)
CO2: 25 mmol/L (ref 22–32)
Calcium: 8.9 mg/dL (ref 8.9–10.3)
Chloride: 105 mmol/L (ref 98–111)
Creatinine, Ser: 0.57 mg/dL (ref 0.44–1.00)
GFR, Estimated: >60 mL/min (ref >60)
Glucose, Bld: 117 mg/dL — ABNORMAL HIGH (ref 70–99)
Potassium: 3.2 mmol/L — ABNORMAL LOW (ref 3.5–5.1)
Sodium: 138 mmol/L (ref 135–145)
Total Bilirubin: 0.6 mg/dL (ref 0.3–1.2)
Total Protein: 7.2 g/dL (ref 6.5–8.1)

## 2021-02-05 LAB — PREGNANCY, URINE: Preg Test, Ur: NEGATIVE

## 2021-02-05 MED ORDER — SODIUM CHLORIDE 0.9 % IV SOLN
1.0000 g | Freq: Once | INTRAVENOUS | Status: AC
  Administered 2021-02-05: 1 g via INTRAVENOUS
  Filled 2021-02-05: qty 10

## 2021-02-05 MED ORDER — OXYCODONE-ACETAMINOPHEN 5-325 MG PO TABS: ORAL_TABLET | ORAL | 0 refills | Status: DC

## 2021-02-05 MED ORDER — KETOROLAC TROMETHAMINE 30 MG/ML IJ SOLN
30.0000 mg | Freq: Once | INTRAMUSCULAR | Status: AC
  Administered 2021-02-05: 30 mg via INTRAVENOUS
  Filled 2021-02-05: qty 1

## 2021-02-05 MED ORDER — ONDANSETRON 4 MG PO TBDP: ORAL_TABLET | ORAL | 0 refills | Status: DC

## 2021-02-05 MED ORDER — CEPHALEXIN 250 MG PO CAPS
250.0000 mg | ORAL_CAPSULE | Freq: Four times a day (QID) | ORAL | 0 refills | Status: DC
Start: 2021-02-05 — End: 2022-05-18

## 2021-02-05 NOTE — ED Triage Notes (Signed)
Pt c/o right sided back pain that radiates to right abdomen that began earlier today. Pt sates it burns when she urinates and her urine is foamy/white/yellow. Also c/o nausea. Denies fevers. Hx of bladder removal.

## 2021-02-05 NOTE — Discharge Instructions (Addendum)
Follow-up with [facility] next week for recheck.  Return if any problems.  Drink plenty of fluids

## 2021-02-05 NOTE — ED Notes (Addendum)
Told pt rx was sent to pharmacy, pt advised to follow up w [facility] next week, pt advised to not drink/drive when taking narcotics and to take full course of abx. Pt did not have any questions, ready for dc. Daughter driving pt home

## 2021-02-05 NOTE — ED Provider Notes (Signed)
[facility]-EMERGENCY DEPT Provider Note   CSN: [id number] Arrival date & time: 02/05/21  0243     History Chief Complaint  Patient presents with   Flank Pain    Sydney Sandoval is a 50 y.o. female.  Patient complains of right flank pain.  Moderate to severe.  The history is provided by the patient and medical records. No language interpreter was used.  Flank Pain This is a new problem. The current episode started 12 to 24 hours ago. The problem occurs constantly. The problem has not changed since onset.Pertinent negatives include no chest pain, no abdominal pain and no headaches. Nothing aggravates the symptoms. She has tried nothing for the symptoms. The treatment provided no relief.      History reviewed. No pertinent past medical history.  Patient Active Problem List   Diagnosis Date Noted   Elevated LFTs 04/12/2018   Jaundice 04/12/2018   Acute cholecystitis 04/12/2018   Microcytic anemia 04/12/2018   Hypokalemia 04/12/2018    Past Surgical History:  Procedure Laterality Date   CESAREAN SECTION     2 c-sections   CHOLECYSTECTOMY N/A 04/14/2018   Procedure: LAPAROSCOPIC CHOLECYSTECTOMY WITH INTRAOPERATIVE CHOLANGIOGRAM;  Surgeon: Berna Bue, MD;  Location: [facility];  Service: General;  Laterality: N/A;   ERCP N/A 04/15/2018   Procedure: ENDOSCOPIC RETROGRADE CHOLANGIOPANCREATOGRAPHY (ERCP);  Surgeon: Jeani Hawking, MD;  Location: [facility];  Service: Endoscopy;  Laterality: N/A;   GIVENS CAPSULE STUDY N/A 03/23/2019   Procedure: GIVENS CAPSULE STUDY;  Surgeon: Shellia Cleverly, DO;  Location: [facility];  Service: Gastroenterology;  Laterality: N/A;   REMOVAL OF STONES  04/15/2018   Procedure: REMOVAL OF STONES;  Surgeon: Jeani Hawking, MD;  Location: [facility];  Service: Endoscopy;;   SPHINCTEROTOMY  04/15/2018   Procedure: Dennison Mascot;  Surgeon: Jeani Hawking, MD;  Location: [facility];  Service: Endoscopy;;     OB History   No  obstetric history on file.     Family History  Problem Relation Age of Onset   Colon cancer Neg Hx     Social History   Tobacco Use   Smoking status: Never   Smokeless tobacco: Never  Vaping Use   Vaping Use: Never used  Substance Use Topics   Alcohol use: Yes    Comment: occ   Drug use: Never    Home Medications Prior to Admission medications   Medication Sig Start Date End Date Taking? Authorizing Provider  cephALEXin (KEFLEX) 250 MG capsule Take 1 capsule (250 mg total) by mouth 4 (four) times daily. 02/05/21  Yes Bethann Berkshire, MD  ondansetron (ZOFRAN ODT) 4 MG disintegrating tablet 4mg  ODT q4 hours prn nausea/vomit 02/05/21  Yes 02/07/21, MD  oxyCODONE-acetaminophen (PERCOCET) 5-325 MG tablet Take 1 every 6 hours as needed for pain not relieved by Motrin alone 02/05/21  Yes 02/07/21, MD  ferrous sulfate 325 (65 FE) MG EC tablet Take 1 tablet (325 mg total) by mouth 3 (three) times daily with meals. Patient taking differently: Take 325 mg by mouth daily.  04/16/18   04/18/18, MD  methotrexate (RHEUMATREX) 2.5 MG tablet Take 10 mg by mouth once a week. Take every Monday    [provider]  predniSONE (DELTASONE) 2.5 MG tablet Take 7.5 mg by mouth daily. 03/12/18   [provider]    Allergies    Patient has no known allergies.  Review of Systems   Review of Systems  Constitutional:  Negative for appetite change  and fatigue.  HENT:  Negative for congestion, ear discharge and sinus pressure.   Eyes:  Negative for discharge.  Respiratory:  Negative for cough.   Cardiovascular:  Negative for chest pain.  Gastrointestinal:  Negative for abdominal pain and diarrhea.  Genitourinary:  Positive for flank pain. Negative for frequency and hematuria.  Musculoskeletal:  Negative for back pain.  Skin:  Negative for rash.  Neurological:  Negative for seizures and headaches.  Psychiatric/Behavioral:  Negative for hallucinations.    Physical  Exam Updated Vital Signs BP (!) 114/58 (BP Location: Left Arm)   Pulse 94   Temp 99.6 F (37.6 C) (Oral)   Resp 16   Ht 5' (1.524 m)   Wt 77.1 kg   SpO2 95%   BMI 33.20 kg/m   Physical Exam Vitals and nursing note reviewed.  Constitutional:      Appearance: She is well-developed.  HENT:     Head: Normocephalic.     Nose: Nose normal.     Mouth/Throat:     Mouth: Mucous membranes are moist.  Eyes:     General: No scleral icterus.    Conjunctiva/sclera: Conjunctivae normal.  Neck:     Thyroid: No thyromegaly.  Cardiovascular:     Rate and Rhythm: Normal rate and regular rhythm.     Heart sounds: No murmur heard.   No friction rub. No gallop.  Pulmonary:     Breath sounds: No stridor. No wheezing or rales.  Chest:     Chest wall: No tenderness.  Abdominal:     General: There is no distension.     Tenderness: There is no abdominal tenderness. There is no rebound.  Genitourinary:    Comments: Tender right flank Musculoskeletal:        General: Normal range of motion.     Cervical back: Neck supple.  Lymphadenopathy:     Cervical: No cervical adenopathy.  Skin:    Findings: No erythema or rash.  Neurological:     Mental Status: She is alert and oriented to person, place, and time.     Motor: No abnormal muscle tone.     Coordination: Coordination normal.  Psychiatric:        Behavior: Behavior normal.    ED Results / Procedures / Treatments   Labs (all labs ordered are listed, but only abnormal results are displayed) Labs Reviewed  URINALYSIS, ROUTINE W REFLEX MICROSCOPIC - Abnormal; Notable for the following components:      Result Value   APPearance HAZY (*)    Hgb urine dipstick LARGE (*)    Protein, ur 100 (*)    Leukocytes,Ua LARGE (*)    RBC / HPF >50 (*)    WBC, UA >50 (*)    All other components within normal limits  COMPREHENSIVE METABOLIC PANEL - Abnormal; Notable for the following components:   Potassium 3.2 (*)    Glucose, Bld 117 (*)     ALT 52 (*)    All other components within normal limits  CBC - Abnormal; Notable for the following components:   Hemoglobin 11.8 (*)    MCH 25.8 (*)    RDW 15.6 (*)    Platelets 411 (*)    All other components within normal limits  URINE CULTURE  PREGNANCY, URINE    EKG None  Radiology CT Renal Stone Study  Result Date: 02/05/2021 CLINICAL DATA:  Flank pain, kidney stone suspected, RIGHT flank pain with burning during urination, microscopic hematuria, negative urine pregnancy test  EXAM: CT ABDOMEN AND PELVIS WITHOUT CONTRAST TECHNIQUE: Multidetector CT imaging of the abdomen and pelvis was performed following the standard protocol without IV contrast. Sagittal and coronal MPR images reconstructed from axial data set. No oral contrast administered for this indication. COMPARISON:  None FINDINGS: Lower chest: Minimal dependent bibasilar atelectasis. Hepatobiliary: Gallbladder surgically absent.  Liver unremarkable. Pancreas: Normal appearance Spleen: Normal appearance Adrenals/Urinary Tract: Adrenal glands and LEFT kidney normal appearance. Mild RIGHT hydronephrosis and hydroureter without definite visualization of a ureteral calculus. Possibility of a passed calculus is raised. No renal masses. LEFT ureter decompressed. Thickening of the anterior superior wall of the urinary bladder focally, question urachal remnant. Stomach/Bowel: Normal appendix. Stomach and bowel loops normal appearance. Vascular/Lymphatic: Aorta normal caliber.  No adenopathy. Reproductive: Unremarkable uterus and adnexa Other: No free air or free fluid. Tiny umbilical hernia containing fat. Musculoskeletal: Unremarkable IMPRESSION: Mild RIGHT hydronephrosis and hydroureter without definite visualization of a ureteral calculus. Possibility of a passed calculus is raised. Thickening of the anterior superior wall of the urinary bladder focally, question urachal remnant; recommend urologic follow-up. Tiny umbilical hernia  containing fat. Electronically Signed   By: Ulyses Southward M.D.   On: 02/05/2021 08:17    Procedures Procedures   Medications Ordered in ED Medications  ketorolac (TORADOL) 30 MG/ML injection 30 mg (has no administration in time range)  cefTRIAXone (ROCEPHIN) 1 g in sodium chloride 0.9 % 100 mL IVPB (0 g Intravenous Stopped 02/05/21 0845)    ED Course  I have reviewed the triage vital signs and the nursing notes.  Pertinent labs & imaging results that were available during my care of the patient were reviewed by me and considered in my medical decision making (see chart for details).    MDM Rules/Calculators/A&P                       Patient with urinary tract infection and possible kidney stones that is not seen.  She is put on Keflex and Zofran and Percocet and follow-up with urology Final Clinical Impression(s) / ED Diagnoses Final diagnoses:  Acute cystitis with hematuria    Rx / DC Orders ED Discharge Orders          Ordered    cephALEXin (KEFLEX) 250 MG capsule  4 times daily        02/05/21 0852    ondansetron (ZOFRAN ODT) 4 MG disintegrating tablet        02/05/21 0852    oxyCODONE-acetaminophen (PERCOCET) 5-325 MG tablet        02/05/21 2440             Bethann Berkshire, MD 02/05/21 [id number]

## 2021-02-05 NOTE — ED Notes (Signed)
Per pt request, called daughter x 3 to try and let her know she can come back to pt's room (no answer). Also let security know up front that pt can have visitor

## 2021-02-05 NOTE — ED Notes (Signed)
Patient transported to CT 

## 2021-02-05 NOTE — ED Notes (Signed)
Pt returned from CT °

## 2021-02-07 LAB — URINE CULTURE: Culture: >=100000 — AB

## 2021-02-08 ENCOUNTER — Telehealth: Payer: Self-pay | Admitting: Emergency Medicine

## 2021-02-08 NOTE — Telephone Encounter (Signed)
Post ED Visit - Positive Culture Follow-up  Culture report reviewed by antimicrobial stewardship pharmacist: [facility] []  [address], Pharm.D. []  [place], Pharm.D., BCPS AQ-ID []  , Pharm.D., BCPS []  Celedonio Miyamoto, Pharm.D., BCPS []  [place], Garvin Fila.D., BCPS, AAHIVP []  , Pharm.D., BCPS, AAHIVP []  Georgina Pillion, PharmD, BCPS []  , PharmD, BCPS []  [place], PharmD, BCPS []  [address], PharmD []  , PharmD, BCPS []  Estella Husk, PharmD  Pharmacy Team []  Lysle Pearl, PharmD []  , PharmD []  Phillips Climes, PharmD []  , Rph []  Agapito Games) , PharmD [x]  Verlan Friends, PharmD []  , PharmD []  Mervyn Gay, PharmD []  , PharmD []  Vinnie Level, PharmD []  [facility], PharmD []  , PharmD []  Len Childs, PharmD   Positive urine culture Treated with Cephalexin, organism sensitive to the same and no further patient follow-up is required at this time.  Dow Blahnik 02/08/2021, 12:18 PM

## 2022-04-30 ENCOUNTER — Ambulatory Visit (INDEPENDENT_AMBULATORY_CARE_PROVIDER_SITE_OTHER): Payer: Self-pay

## 2022-04-30 ENCOUNTER — Ambulatory Visit: Payer: Self-pay | Attending: Internal Medicine | Admitting: Internal Medicine

## 2022-04-30 ENCOUNTER — Telehealth: Payer: Self-pay | Admitting: Pharmacist

## 2022-04-30 ENCOUNTER — Encounter: Payer: Self-pay | Admitting: Internal Medicine

## 2022-04-30 VITALS — BP 113/75 | HR 61 | Resp 14 | Ht 59.0 in | Wt 156.2 lb

## 2022-04-30 DIAGNOSIS — M0579 Rheumatoid arthritis with rheumatoid factor of multiple sites without organ or systems involvement: Secondary | ICD-10-CM

## 2022-04-30 DIAGNOSIS — M79642 Pain in left hand: Secondary | ICD-10-CM

## 2022-04-30 DIAGNOSIS — Z79899 Other long term (current) drug therapy: Secondary | ICD-10-CM

## 2022-04-30 DIAGNOSIS — H5789 Other specified disorders of eye and adnexa: Secondary | ICD-10-CM

## 2022-04-30 DIAGNOSIS — R7612 Nonspecific reaction to cell mediated immunity measurement of gamma interferon antigen response without active tuberculosis: Secondary | ICD-10-CM

## 2022-04-30 DIAGNOSIS — S66901A Unspecified injury of unspecified muscle, fascia and tendon at wrist and hand level, right hand, initial encounter: Secondary | ICD-10-CM

## 2022-04-30 DIAGNOSIS — M79641 Pain in right hand: Secondary | ICD-10-CM

## 2022-04-30 NOTE — Progress Notes (Unsigned)
Pharmacy Note Subjective: Patient presents today to [facility] for follow up office visit. Patient seen by the pharmacist for counseling on Humira for rheumatoid arthritis.   Diagnosis of heart failure: No  Objective:  CBC    Component Value Date/Time   WBC 9.0 02/05/2021 0308   RBC 4.57 02/05/2021 0308   HGB 11.8 (L) 02/05/2021 0308   HCT 37.6 02/05/2021 0308   PLT 411 (H) 02/05/2021 0308   MCV 82.3 02/05/2021 0308   MCH 25.8 (L) 02/05/2021 0308   MCHC 31.4 02/05/2021 0308   RDW 15.6 (H) 02/05/2021 0308   LYMPHSABS 1.2 04/16/2018 0506   MONOABS 0.3 04/16/2018 0506   EOSABS 0.0 04/16/2018 0506   BASOSABS 0.0 04/16/2018 0506     CMP     Component Value Date/Time   NA 138 02/05/2021 0308   K 3.2 (L) 02/05/2021 0308   CL 105 02/05/2021 0308   CO2 25 02/05/2021 0308   GLUCOSE 117 (H) 02/05/2021 0308   BUN 14 02/05/2021 0308   CREATININE 0.57 02/05/2021 0308   CALCIUM 8.9 02/05/2021 0308   PROT 7.2 02/05/2021 0308   ALBUMIN 3.8 02/05/2021 0308   AST 31 02/05/2021 0308   ALT 52 (H) 02/05/2021 0308   ALKPHOS 76 02/05/2021 0308   BILITOT 0.6 02/05/2021 0308   GFRNONAA >60 02/05/2021 0308   GFRAA >60 04/16/2018 0506      Baseline Immunosuppressant Therapy Labs TB GOLD   Hepatitis Panel    Latest Ref Rng & Units 11/22/2018   10:25 AM  Hepatitis  Hep B Surface Ag NON-REACTI NON-REACTIVE   Hep C Ab NON-REACTI NON-REACTIVE   Hep A IgM NON-REACTI NON-REACTIVE    HIV Lab Results  Component Value Date   HIV Non Reactive 04/13/2018   Immunoglobulins    Latest Ref Rng & Units 11/22/2018   10:25 AM  Immunoglobulin Electrophoresis  IgA  47 - 310 mg/dL 47 - 270 mg/dL 623    762   IgG 831 - 1,640 mg/dL 5,176   IgM 50 - 160 mg/dL 737    SPEP    Latest Ref Rng & Units 02/05/2021    3:08 AM  Serum Protein Electrophoresis  Total Protein 6.5 - 8.1 g/dL 7.2    Chest x-ray: 1/0/62 -Negative chest x-ray.  Assessment/Plan:  Counseled patient that  Humira is a TNF blocking agent.  Counseled patient on purpose, proper use, and adverse effects of Humira.  Reviewed the most common adverse effects including infections, headache, and injection site reactions. Discussed that there is the possibility of an increased risk of malignancy including non-melanoma skin cancer but it is not well understood if this increased risk is due to the medication or the disease state.  Advised patient to get yearly dermatology exams due to risk of skin cancer. Counseled patient that Humira should be held prior to scheduled surgery.  Counseled patient to avoid live vaccines while on Humira.  Recommend annual influenza, PCV 15 or PCV20 or Pneumovax 23, and Shingrix as indicated.  Reviewed the importance of regular labs while on Humira therapy. Will monitor CBC and CMP 1 month after starting and then every 3 months routinely thereafter. Will monitor TB gold annually. Standing orders placed. Provided patient with medication education material and answered all questions.  Patient consented to Humira.  Will upload consent into the media tab.  Reviewed storage instructions of Humira.  Advised initial injection must be administered in office.  Patient verbalized understanding.    Patient advised  to have yearly skin checks completed with PCP since she is uninsured. Also provided [employer] application form.  Dose will be for rheumatoid arthritis Humira 40 mg every 14 days.  Prescription pending lab results and patient assistance program approval. Patient assistance program application completed in clinic today.   Chesley Mires, PharmD, MPH, BCPS, CPP Clinical Pharmacist (Rheumatology and Pulmonology)

## 2022-04-30 NOTE — Telephone Encounter (Signed)
Patient will be new start Humira for RA. She is uninsured. [employer] Assist application for pt assistance completed at [facility] today. Pending provider signature on forms and baseline labs.  Chesley Mires, PharmD, MPH, BCPS, CPP Clinical Pharmacist (Rheumatology and Pulmonology)

## 2022-04-30 NOTE — Progress Notes (Signed)
Office Visit Note  Patient: Sydney Sandoval             Date of Birth: [birth year]           MRN: [id number]             PCP: Marya Landry Referring: Sharyl Nimrod, PA Visit Date: 04/30/2022   Subjective:  New Patient (Initial Visit) (Inflammation in joints. Patient wants to know her level of inflammation. )   History of Present Illness: Sydney Sandoval is a 51 y.o. female here for rheumatoid arthritis. She was previously diagnosed in 2016 and prescribed treatment with methotrexate. She apparently stopped this due to LFT abnormalities. She was also treated also with prednisone during some portion of the time. No regular rheumatology follow and intermittent with PCP due to insurance.  She has taken some NSAIDs during these times for treating her symptoms when off medication.  More recently she has been seeing a doctor helping control symptoms with the monthly intramuscular betamethasone injection. These shots improve symptoms for about 2-3 weeks but she has worsening of pain in between treatments. Her last injection was about a week ago and symptoms are minimal at this time as a result. More recently she was exercising doing some kind of handheld weight and felt a sudden shift in the tendons on back of her right hand and since then cannot fully extend her 4th and 5th finger. Also with increased swelling at the dorsal side of the wrist.  She also has eye symptoms with pain but no major visual acuity changes. She was treated with topical drops and plugs for dryness but also concern for scleritis or uveitis associated with RA and treated with steroid eyedrops.  12/2019 RF 401  10/2018 HAV/HBV/HCV neg  Activities of Daily Living:  Patient reports morning stiffness for 2 hours.   Patient Reports nocturnal pain.  Difficulty dressing/grooming: Reports Difficulty climbing stairs: Reports Difficulty getting out of chair: Reports Difficulty using hands for taps, buttons, cutlery, and/or writing:  Reports  Review of Systems  Constitutional:  Positive for fatigue.  HENT:  Negative for mouth sores and mouth dryness.   Eyes:  Negative for dryness.  Respiratory:  Negative for shortness of breath.   Cardiovascular:  Negative for chest pain and palpitations.  Gastrointestinal:  Negative for blood in stool, constipation and diarrhea.  Endocrine: Negative for increased urination.  Genitourinary:  Negative for involuntary urination.  Musculoskeletal:  Positive for joint pain, gait problem, joint pain, joint swelling, myalgias, muscle weakness, morning stiffness, muscle tenderness and myalgias.  Skin:  Negative for color change, rash, hair loss and sensitivity to sunlight.  Allergic/Immunologic: Negative for susceptible to infections.  Neurological:  Negative for dizziness and headaches.  Hematological:  Negative for swollen glands.  Psychiatric/Behavioral:  Negative for depressed mood and sleep disturbance. The patient is not nervous/anxious.     PMFS History:  Patient Active Problem List   Diagnosis Date Noted   Rheumatoid arthritis with rheumatoid factor of multiple sites without organ or systems involvement (HCC) 04/30/2022   High risk medication use 04/30/2022   Injury of extensor tendon of right hand 04/30/2022   Eye inflammation 04/30/2022   Elevated LFTs 04/12/2018   Jaundice 04/12/2018   Acute cholecystitis 04/12/2018   Microcytic anemia 04/12/2018   Hypokalemia 04/12/2018    History reviewed. No pertinent past medical history.  Family History  Problem Relation Age of Onset   Diabetes Mother    Diabetes Sister    Healthy  Son    Healthy Daughter    Healthy Daughter    Colon cancer Neg Hx    Past Surgical History:  Procedure Laterality Date   CESAREAN SECTION     2 c-sections   CHOLECYSTECTOMY N/A 04/14/2018   Procedure: LAPAROSCOPIC CHOLECYSTECTOMY WITH INTRAOPERATIVE CHOLANGIOGRAM;  Surgeon: Berna Bue, MD;  Location: [facility];  Service: General;  Laterality:  N/A;   ERCP N/A 04/15/2018   Procedure: ENDOSCOPIC RETROGRADE CHOLANGIOPANCREATOGRAPHY (ERCP);  Surgeon: Jeani Hawking, MD;  Location: [facility];  Service: Endoscopy;  Laterality: N/A;   GIVENS CAPSULE STUDY N/A 03/23/2019   Procedure: GIVENS CAPSULE STUDY;  Surgeon: Shellia Cleverly, DO;  Location: [facility];  Service: Gastroenterology;  Laterality: N/A;   REMOVAL OF STONES  04/15/2018   Procedure: REMOVAL OF STONES;  Surgeon: Jeani Hawking, MD;  Location: [facility];  Service: Endoscopy;;   SPHINCTEROTOMY  04/15/2018   Procedure: Dennison Mascot;  Surgeon: Jeani Hawking, MD;  Location: [facility];  Service: Endoscopy;;   Social History   Social History Narrative   Not on file    There is no immunization history on file for this patient.   Objective: Vital Signs: BP 113/75 (BP Location: Left Arm, Patient Position: Sitting, Cuff Size: Large)   Pulse 61   Resp 14   Ht 4\' 11"  (1.499 m)   Wt 156 lb 3.2 oz (70.9 kg)   LMP 10/28/2021 (Approximate)   BMI 31.55 kg/m    Physical Exam HENT:     Mouth/Throat:     Mouth: Mucous membranes are moist.     Pharynx: Oropharynx is clear.  Cardiovascular:     Rate and Rhythm: Normal rate and regular rhythm.  Pulmonary:     Effort: Pulmonary effort is normal.     Breath sounds: Normal breath sounds.  Lymphadenopathy:     Cervical: No cervical adenopathy.  Skin:    General: Skin is warm and dry.     Findings: No rash.  Neurological:     Mental Status: She is alert.  Psychiatric:        Mood and Affect: Mood normal.      Musculoskeletal Exam:  Shoulders full ROM no tenderness or swelling Elbows full ROM no tenderness or swelling Right wrist with ulnar subluxation and severely decreased extension ROM, dorsal swelling and soft tissue mass, left wrist swelling without pain and ROM grossly intact Unable to extend 4th and 5th fingers fully, decreased strength, flexion ROM intact Knees full ROM no tenderness or swelling crepitus  present Ankles full ROM no tenderness or swelling Bilateral 1st MTP bunions with mild to moderate lateral deviation   CDAI Exam: CDAI Score: 15  Patient Global: 30 mm; Provider Global: 60 mm Swollen: 4 ; Tender: 2  Joint Exam 04/30/2022      Right  Left  Elbow   Tender     Wrist  Swollen Tender  Swollen   MCP 1     Swollen   MCP 2  Swollen         Investigation: No additional findings.  Imaging: XR Hand 2 View Left  Result Date: 04/30/2022 Xray left hand 2 views Moderate subluxation of carpal bones on wrist with cystic changes and prominent erosions at head of ulna and radioulnar joint. MCP joint space mild narrowing. PIP and DIP joints intact. Bone mineralization looks consistent with generalized osteopenia. Impression Longstanding erosive RA changes most advanced in wrist  XR Hand 2 View Right  Result Date: 04/30/2022 Xray right hand  2 views Radiocarpal and carpal joint spaces narrowing with multiple cystic changes and probable erosions of ulnar head surface. MCP joint space narrowing and cysts in metacarpal heads. PIP and DIP joints appear normal. Bone mineralization shows generalized osteopenia. Impression Longstanding erosive RA involving predominantly wrists   Recent Labs: Lab Results  Component Value Date   WBC 8.1 04/30/2022   HGB 13.9 04/30/2022   PLT 358 04/30/2022   NA 138 02/05/2021   K 3.2 (L) 02/05/2021   CL 105 02/05/2021   CO2 25 02/05/2021   GLUCOSE 117 (H) 02/05/2021   BUN 14 02/05/2021   CREATININE 0.57 02/05/2021   BILITOT 0.6 02/05/2021   ALKPHOS 76 02/05/2021   AST 31 02/05/2021   ALT 52 (H) 02/05/2021   PROT 7.2 02/05/2021   ALBUMIN 3.8 02/05/2021   CALCIUM 8.9 02/05/2021   GFRAA >60 04/16/2018    Speciality Comments: No specialty comments available.  Procedures:  No procedures performed Allergies: Patient has no known allergies.   Assessment / Plan:     Visit Diagnoses: Rheumatoid arthritis with rheumatoid factor of multiple sites  without organ or systems involvement ([place]) - Plan: XR Hand 2 View Right, XR Hand 2 View Left, CBC with Differential/Platelet  Longstanding seropositive erosive RA currently on very poor control regimen with intermittent steroid injections only. Could not continue MTX in the past due to liver function tests. With ophthalmologic involvement and erosive disease plan to start on Humira for treatment. Checking sedimentation rate for disease activity monitoring as well but may be depressed due to recent steroid dosing. Xrays of bilateral hands showing extensive wrist damage and lesser MCP involvement with relative sparing of the distal fingers. Bone density looks very low, not sure if just the hands or also at risk of osteoporosis with long term steroids and RA Hx.   High risk medication use - Plan: COMPLETE METABOLIC PANEL WITH GFR, QuantiFERON-TB Gold Plus, Sedimentation rate  Planning to start Humira treatment checking CMP, TB screening, CBC for baseline medication monitoring. Reviewed medication risks including injection site reactions, infections, malignancy.  Injury of extensor tendon of right hand, initial encounter  Reported event and our exam are suggestive for tendon rupture which could occur related to longstanding RA and her degree of ulnar subluxation. I suspect will need orthopedic involvement although also need to achieve control of underlying disease.  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 She would benefit to start on systemic therapy for this problem.  Orders: Orders Placed This Encounter  Procedures   XR Hand 2 View Right   XR Hand 2 View Left   CBC with Differential/Platelet   COMPLETE METABOLIC PANEL WITH GFR   QuantiFERON-TB Gold Plus   Sedimentation rate   No orders of the defined types were placed in this encounter.    Follow-Up Instructions: Return in about 2 months (around 06/30/2022) for New pt RA f/u  24mos.   Collier Salina, MD  Note - This record has been created using [employer].  Chart creation errors have been sought, but may not always  have been located. Such creation errors do not reflect on  the standard of medical care.

## 2022-04-30 NOTE — Patient Instructions (Signed)
Adalimumab Injection Qu es este medicamento? El ADALIMUMAB trata afecciones autoinmunes, tales como psoriasis, artritis, enfermedad de Crohn y colitis ulcerativa. Acta desacelerando un sistema inmunolgico hiperactivo. Pertenece a un grupo de medicamentos llamados inhibidores del factor de necrosis tumoral. Es un anticuerpo monoclonal. Este medicamento puede ser utilizado para otros usos; si tiene alguna pregunta consulte con su proveedor de atencin mdica o con su farmacutico. MARCAS COMUNES: AMJEVITA, Humira Qu le debo informar a mi profesional de la salud antes de tomar este medicamento? Necesitan saber si usted presenta alguno de los siguientes problemas o situaciones: Cncer Diabetes (nivel elevado de azcar en la sangre) Realizacin de cirugas Enfermedad cardiaca Hepatitis B Problemas del sistema inmunolgico Infecciones, tales como tuberculosis (TB) u otras infecciones bacterianas, micticas o virales Esclerosis mltiple [place] reciente o programada Una reaccin alrgica o inusual al adalimumab, al manitol, al ltex, al caucho, otros medicamentos, alimentos, colorantes o conservantes Si est embarazada o buscando quedar embarazada Si est amamantando a un beb Cmo debo Chemical engineer este medicamento? Este medicamento se inyecta debajo de la piel. Su equipo de atencin Science writer en un hospital o en un entorno clnico. Tambin puede administrarse en su casa. Si recibe [employer] en su casa, le ensearn cmo prepararlo y administrarlo. Use el medicamento exactamente como se le indique. Use el medicamento segn las instrucciones en la etiqueta del medicamento. Siga usndolo a menos que su equipo de atencin le indique dejar de Media planner. Este medicamento viene con INSTRUCCIONES DE USO. Pdale a su farmacutico que le indique cmo usar [employer]. Lea la informacin atentamente. Hable con su farmacutico o su equipo de atencin si tiene [place] pregunta. Es  importante que deseche las agujas y las jeringas usadas en un recipiente resistente a los pinchazos. No las deseche en la basura. Si no tiene un recipiente resistente a los pinchazos, llame a su farmacutico o a su equipo de atencin para obtenerlo. Su farmacutico le dar una Gua del medicamento especial (MedGuide, nombre en ingls) con cada receta y en cada ocasin que la vuelva a surtir. Si recibe [employer] en un hospital o clnica, le entregarn una Gua del medicamento especial (MedGuide, nombre en ingls) antes de cada tratamiento. Asegrese de leer esta informacin cada vez cuidadosamente. Hable con su equipo de atencin sobre el uso de este medicamento en nios. Aunque se puede recetar a nios tan pequeos como de 2 aos de edad con ciertas afecciones, existen precauciones que deben tomarse. Sobredosis: Pngase en contacto inmediatamente con un centro toxicolgico o una sala de urgencia si usted cree que haya tomado demasiado medicamento. ATENCIN: [employer] es solo para usted. No comparta este medicamento con nadie. Qu sucede si me olvido de una dosis? Si recibe [employer] hospital o en una clnica: es importante que no omita una dosis. Llame a su equipo de atencin si no puede asistir a una cita. Si se administra usted mismo el medicamento en su casa: si se olvida una dosis, adminstrela lo antes posible. Si es casi la hora de la prxima dosis, administre solo esa dosis. No se administre dosis adicionales o dobles. Llame a su equipo de atencin si tiene alguna pregunta. Qu puede interactuar con este medicamento? No use este medicamento con ninguno de los siguientes productos: Abatacept Anakinra Medicamentos biolgicos, tales como certolizumab, etanercept, golimumab, infliximab Vacunas de virus vivos Este medicamento tambin podra Product/process development scientist con los siguientes productos: Ciclosporina Teofilina Vacunas Warfarina Puede ser que esta lista no menciona todas  las posibles interacciones. Informe a  su profesional de la salud de todos los productos a base de hierbas, medicamentos de venta libre o suplementos nutritivos que est tomando. Si usted fuma, consume bebidas alcohlicas o si utiliza drogas ilegales, indqueselo tambin a su profesional de [employer]. Algunas sustancias pueden interactuar con su medicamento. A qu debo estar atento al usar [employer]? Visite a su equipo de atencin para que revise su evolucin peridicamente. Si los sntomas no comienzan a mejorar o si empeoran, informe a su equipo de atencin. Le realizarn una prueba de tuberculosis (TB) antes de comenzar a recibir [employer]. Si su equipo de Armed forces training and education officer medicamento para la TB, debe usarlo primero, antes de Games developer a Producer, television/film/video. Asegrese de terminar todo el ciclo del medicamento para la TB. Este medicamento puede aumentar su riesgo de contraer una infeccin. Llame para pedir consejo a su equipo de atencin si tiene fiebre, escalofros, dolor de garganta o cualquier otro sntoma de resfriado o gripe. No se trate usted mismo. Trate de no acercarse a personas que estn enfermas. Hable con su equipo de atencin sobre su riesgo de cncer. Usted podra tener mayor riesgo de desarrollar ciertos tipos de cncer si [place] este medicamento. Qu efectos secundarios puedo tener al [employer] este medicamento? Efectos secundarios que debe informar a su equipo de atencin tan pronto como sea posible: Reacciones alrgicas: erupcin cutnea, comezn/picazn, urticaria, hinchazn de la cara, los labios, la lengua o la garganta Anemia aplsica: debilidad o fatiga inusuales, mareos, dolor de cabeza, dificultad para Industrial/product designer, aumento del sangrado o Musician, hormigueo o entumecimiento Insuficiencia cardiaca: falta de aire, hinchazn de los tobillos, los pies o las manos, aumento de peso repentino, debilidad o fatiga inusuales Infeccin: fiebre, escalofros,  tos, dolor de garganta, heridas que no sanan, dolor o problemas para Geographical information systems officer, sensacin general de molestia o [employer] similar al lupus: dolor, hinchazn o rigidez de las articulaciones, erupcin cutnea en forma de mariposa en la cara, erupciones cutneas que empeoran en el sol, fiebre, debilidad o fatiga inusuales Sangrado o moretones inusuales Efectos secundarios que generalmente no requieren atencin mdica (debe informarlos a su equipo de atencin si persisten o si son molestos): Dolor de Research scientist (life sciences), enrojecimiento o Marketing executive de la inyeccin [place] o congestin nasal Dolor de Horticulturist, commercial estomacal Puede ser que esta lista no menciona todos los posibles efectos secundarios. Comunquese a su mdico por asesoramiento mdico [employer]. Usted puede informar los efectos secundarios a la FDA por telfono al 1-800-FDA-1088. Dnde debo guardar mi medicina? Mantenga fuera del alcance de nios y Neurosurgeon. Gurdelo en el refrigerador. No congele. Mantenga este medicamento en el envase original hasta que est listo para usarlo. Proteja de Statistician. Deseche todo el medicamento que no haya utilizado despus de la fecha de vencimiento. Este medicamento puede conservarse a Marketing executive ambiente durante un mximo de [address] Mantenga este medicamento en su envase original. Proteja de la luz. Si se almacena a Marketing executive ambiente, deseche todo el medicamento sin usar despus de [address] o despus de la fecha de vencimiento, lo que suceda primero. Para desechar los medicamentos que ya no necesite o que estn vencidos: Lleve el medicamento a un programa de recuperacin de medicamentos. Consulte con su farmacia o con una entidad reguladora para encontrar un lugar donde llevarlo. Si no puede Government social research officer, pregntele a Film/video editor o a su equipo de atencin cmo desecharlo de IT consultant. ATENCIN: Este folleto es un resumen. Puede ser que  no cubra  toda la posible informacin. Si usted tiene preguntas acerca de esta medicina, consulte con su mdico, su farmacutico o su profesional de Radiographer, therapeutic.  2023 Elsevier/Gold Standard (2022-01-07 00:00:00)

## 2022-05-02 LAB — QUANTIFERON-TB GOLD PLUS
Mitogen-NIL: >10 IU/mL
NIL: 0.04 IU/mL
QuantiFERON-TB Gold Plus: POSITIVE — AB
TB1-NIL: 0.65 IU/mL
TB2-NIL: 0.58 IU/mL

## 2022-05-02 LAB — COMPLETE METABOLIC PANEL WITH GFR
AG Ratio: 1.5 (calc) (ref 1.0–2.5)
ALT: 22 U/L (ref 6–29)
AST: 18 U/L (ref 10–35)
Albumin: 4.1 g/dL (ref 3.6–5.1)
Alkaline phosphatase (APISO): 91 U/L (ref 31–125)
BUN/Creatinine Ratio: 24 (calc) — ABNORMAL HIGH (ref 6–22)
BUN: 12 mg/dL (ref 7–25)
CO2: 27 mmol/L (ref 20–32)
Calcium: 9.6 mg/dL (ref 8.6–10.2)
Chloride: 104 mmol/L (ref 98–110)
Creat: 0.49 mg/dL — ABNORMAL LOW (ref 0.50–0.99)
Globulin: 2.7 g/dL (calc) (ref 1.9–3.7)
Glucose, Bld: 99 mg/dL (ref 65–99)
Potassium: 4 mmol/L (ref 3.5–5.3)
Sodium: 139 mmol/L (ref 135–146)
Total Bilirubin: 0.8 mg/dL (ref 0.2–1.2)
Total Protein: 6.8 g/dL (ref 6.1–8.1)
eGFR: 115 mL/min/{1.73_m2} (ref >=60)

## 2022-05-02 LAB — CBC WITH DIFFERENTIAL/PLATELET
Absolute Monocytes: 656 cells/uL (ref 200–950)
Basophils Absolute: 16 cells/uL (ref 0–200)
Basophils Relative: 0.2 %
Eosinophils Absolute: 32 cells/uL (ref 15–500)
Eosinophils Relative: 0.4 %
HCT: 43.1 % (ref 35.0–45.0)
Hemoglobin: 13.9 g/dL (ref 11.7–15.5)
Lymphs Abs: 1863 cells/uL (ref 850–3900)
MCH: 27.1 pg (ref 27.0–33.0)
MCHC: 32.3 g/dL (ref 32.0–36.0)
MCV: 84 fL (ref 80.0–100.0)
MPV: 9.4 fL (ref 7.5–12.5)
Monocytes Relative: 8.1 %
Neutro Abs: 5532 cells/uL (ref 1500–7800)
Neutrophils Relative %: 68.3 %
Platelets: 358 10*3/uL (ref 140–400)
RBC: 5.13 10*6/uL — ABNORMAL HIGH (ref 3.80–5.10)
RDW: 14 % (ref 11.0–15.0)
Total Lymphocyte: 23 %
WBC: 8.1 10*3/uL (ref 3.8–10.8)

## 2022-05-02 LAB — SEDIMENTATION RATE: Sed Rate: 6 mm/h (ref 0–20)

## 2022-05-04 NOTE — Telephone Encounter (Signed)
Signed provider form for [employer] received. Submission of pt assistance application for Humira is pending. Patient's TB gold was negative on 04/30/22. Will require CXR and ID referral before she can start Humira  Will place completed application in PAP pending info folder in pharmacy office.  Chesley Mires, PharmD, MPH, BCPS, CPP Clinical Pharmacist (Rheumatology and Pulmonology)

## 2022-05-04 NOTE — Progress Notes (Signed)
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 This is a type of chronic infection most often affecting the lungs. Does she had a known history of tuberculosis or has she ever been treated for this?  If she has never been treated for it already we need to address this before she could safely start the medication. The rheumatoid arthritis medicines can cause this infection to become active and cause problems. The first step would be to get a chest xray. I also would need to refer her to seeing the infectious disease clinic about treatment for this.  Because there may be delay in starting new medicine for this I think it may also be good to go ahead and refer her to see an orthopedic surgeon about her hand. If agreeable would refer to Dr. Roda Shutters.   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 She reports taking medicine for 3 months which sounds consistent with completed treatment. Based on this would not plan re-treatment of latent disease for antibody positivity. Will order chest xray to exclude active disease. She is agreeable to referral to orthopedic surgery clinic.

## 2022-05-05 NOTE — Addendum Note (Signed)
Addended by: Fuller Plan on: 05/05/2022 11:59 AM   Modules accepted: Orders

## 2022-05-08 ENCOUNTER — Ambulatory Visit
Admission: RE | Admit: 2022-05-08 | Discharge: 2022-05-08 | Disposition: A | Discharge status: Home / Self Care | Payer: No Typology Code available for payment source | Source: Ambulatory Visit | Attending: Internal Medicine | Admitting: Internal Medicine

## 2022-05-08 ENCOUNTER — Ambulatory Visit: Payer: Self-pay | Admitting: Orthopaedic Surgery

## 2022-05-08 DIAGNOSIS — R7612 Nonspecific reaction to cell mediated immunity measurement of gamma interferon antigen response without active tuberculosis: Secondary | ICD-10-CM

## 2022-05-11 NOTE — Telephone Encounter (Signed)
Noted. Will move forward with Humira PAP submission  Knox Saliva, PharmD, MPH, BCPS, CPP Clinical Pharmacist (Rheumatology and Pulmonology)

## 2022-05-12 ENCOUNTER — Ambulatory Visit (INDEPENDENT_AMBULATORY_CARE_PROVIDER_SITE_OTHER): Payer: Self-pay | Admitting: Orthopaedic Surgery

## 2022-05-12 DIAGNOSIS — M79641 Pain in right hand: Secondary | ICD-10-CM

## 2022-05-12 NOTE — Progress Notes (Signed)
Office Visit Note   Patient: Sydney Sandoval           Date of Birth: [birth year]           MRN: [id number] Visit Date: 05/12/2022              Requested by: Fuller Plan, MD [address] [place],  [place] [address] PCP: Sandre Kitty, PA-C   Assessment & Plan: Visit Diagnoses:  1. 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  At this point, we would like to make referral to orthopedic hand surgery for surgical management.  She will follow-up with [place] as needed.  This was all discussed through a Spanish-speaking interpreter.  Follow-Up Instructions: Return if symptoms worsen or fail to improve.   Orders:  Orders Placed This Encounter  Procedures   Ambulatory referral to Orthopedic Surgery   No orders of the defined types were placed in this encounter.     Procedures: No procedures performed   Clinical Data: No additional findings.   Subjective: Chief Complaint  Patient presents with   Right Hand - Pain    HPI patient is a pleasant right-hand-dominant Spanish-speaking female who is here today with interpreter.  She is here with right hand and wrist pain.  She has underlying rheumatoid arthritis and has been on methotrexate as well as steroids.  About 6 months ago, she was working out with hand weights when she felt pain and a weird sensation to the right ring and small fingers.  She has had pain and stiffness to the hand since.  She is unable to fully extend the ring and small fingers.  She denies any paresthesias to the right hand.  Review of Systems as detailed in HPI.  All others reviewed and are negative.   Objective: Vital Signs: LMP 10/28/2021 (Approximate)   Physical Exam well-developed well-nourished female no acute distress but alert and oriented x3.  Ortho Exam right hand exam reveals deformity at the wrist with evidence of extensor tenosynovitis throughout.   She is unable to fully extend the ring and small fingers at the MCP joint.  She is unable to keep them in an extended position when I passively placed him there.  Full range of motion of the PIP and DIP joints. Specialty Comments:  No specialty comments available.  Imaging: No new imaging   PMFS History: Patient Active Problem List   Diagnosis Date Noted   Rheumatoid arthritis with rheumatoid factor of multiple sites without organ or systems involvement (HCC) 04/30/2022   High risk medication use 04/30/2022   Injury of extensor tendon of right hand 04/30/2022   Eye inflammation 04/30/2022   Elevated LFTs 04/12/2018   Jaundice 04/12/2018   Acute cholecystitis 04/12/2018   Microcytic anemia 04/12/2018   Hypokalemia 04/12/2018   No past medical history on file.  Family History  Problem Relation Age of Onset   Diabetes Mother    Diabetes Sister    Healthy Son    Healthy Daughter    Healthy Daughter    Colon cancer Neg Hx     Past Surgical History:  Procedure Laterality Date   CESAREAN SECTION     2 c-sections   CHOLECYSTECTOMY N/A 04/14/2018   Procedure: LAPAROSCOPIC CHOLECYSTECTOMY WITH INTRAOPERATIVE CHOLANGIOGRAM;  Surgeon: Berna Bue, MD;  Location: [facility];  Service: General;  Laterality: N/A;   ERCP N/A 04/15/2018   Procedure: ENDOSCOPIC RETROGRADE  CHOLANGIOPANCREATOGRAPHY (ERCP);  Surgeon: Carol Ada, MD;  Location: [facility];  Service: Endoscopy;  Laterality: N/A;   GIVENS CAPSULE STUDY N/A 03/23/2019   Procedure: GIVENS CAPSULE STUDY;  Surgeon: Lavena Bullion, DO;  Location: [facility];  Service: Gastroenterology;  Laterality: N/A;   REMOVAL OF STONES  04/15/2018   Procedure: REMOVAL OF STONES;  Surgeon: Carol Ada, MD;  Location: [facility];  Service: Endoscopy;;   SPHINCTEROTOMY  04/15/2018   Procedure: Joan Mayans;  Surgeon: Carol Ada, MD;  Location: [facility];  Service: Endoscopy;;   Social History   Occupational History   Not on  file  Tobacco Use   Smoking status: Never   Smokeless tobacco: Never  Vaping Use   Vaping Use: Never used  Substance and Sexual Activity   Alcohol use: Yes    Comment: occ   Drug use: Never   Sexual activity: Not on file

## 2022-05-12 NOTE — Telephone Encounter (Signed)
Submitted Patient Assistance Application to [place] for [place] along with provider portion, patient portion, and med list. Will update patient when we receive a response.  Fax# [phone] Phone# [phone]  Knox Saliva, PharmD, MPH, BCPS, CPP Clinical Pharmacist (Rheumatology and Pulmonology)

## 2022-05-13 NOTE — Telephone Encounter (Signed)
Received a fax from  [employer] regarding an approval for [place] patient assistance from 05/13/22 to 05/14/23.   Phone number: [phone]  Knox Saliva, PharmD, MPH, BCPS, CPP Clinical Pharmacist (Rheumatology and Pulmonology)

## 2022-05-14 NOTE — Telephone Encounter (Signed)
Called patient using Spanish interpreter, [place], [place] regarding Humira patient assistance approval.  Patient provided with [facility] phone number to pick up call when they call. Recommended her to call company tomorrow if she has not yet heard from them.  She is scheduled for Humira new start on 05/18/22. Will use sample. Advised her to schedule shipment to her home by 06/01/22 when her next dose of Humira would be due.  Knox Saliva, PharmD, MPH, BCPS, CPP Clinical Pharmacist (Rheumatology and Pulmonology)

## 2022-05-18 ENCOUNTER — Ambulatory Visit: Payer: Self-pay | Attending: Internal Medicine | Admitting: Pharmacist

## 2022-05-18 ENCOUNTER — Other Ambulatory Visit: Payer: Self-pay | Admitting: Internal Medicine

## 2022-05-18 DIAGNOSIS — Z79899 Other long term (current) drug therapy: Secondary | ICD-10-CM

## 2022-05-18 DIAGNOSIS — Z7189 Other specified counseling: Secondary | ICD-10-CM

## 2022-05-18 DIAGNOSIS — M0579 Rheumatoid arthritis with rheumatoid factor of multiple sites without organ or systems involvement: Secondary | ICD-10-CM

## 2022-05-18 MED ORDER — PREDNISONE 5 MG PO TABS: ORAL_TABLET | ORAL | 0 refills | Status: AC

## 2022-05-18 MED ORDER — HUMIRA (2 PEN) 40 MG/0.4ML ~~LOC~~ AJKT: 40.0000 mg | AUTO-INJECTOR | SUBCUTANEOUS | 0 refills | Status: DC

## 2022-05-18 NOTE — Patient Instructions (Signed)
Your next HUMIRA dose is due on 06/01/22, 06/15/22, and every 14 days thereafter  Melbeta if you have signs or symptoms of an infection. You can resume once you feel better or back to your baseline. HOLD HUMIRA if you start antibiotics to treat an infection. HOLD HUMIRA around the time of surgery/procedures. Your surgeon will be able to provide recommendations on when to hold BEFORE and when you are cleared to [place].  Pharmacy information: Your prescription will be shipped from [facility]. Their phone number is [phone] Please call to schedule shipment and confirm address. They will mail your medication to your home.  Labs are due in 1 month then every 3 months. Lab hours are from Monday to Thursday 1:30-4:30pm and Friday 1:30-4pm. You do not need an appointment if you come for labs during these times.  How to manage an injection site reaction: Remember the 5 C's: COUNTER - leave on the counter at least 30 minutes but up to overnight to bring medication to room temperature. This may help prevent stinging COLD - place something cold (like an ice gel pack or cold water bottle) on the injection site just before cleansing with alcohol. This may help reduce pain CLARITIN - use Claritin (generic name is loratadine) for the first two weeks of treatment or the day of, the day before, and the day after injecting. This will help to minimize injection site reactions CORTISONE CREAM - apply if injection site is irritated and itching CALL ME - if injection site reaction is bigger than the size of your fist, looks infected, blisters, or if you develop hives

## 2022-05-18 NOTE — Progress Notes (Signed)
Pharmacy Note  Subjective:   Patient presents to clinic today to receive first dose of Humira for rheumatoid arthritis. She saw Dr. Roda Shutters with orthopedics on 05/12/22. A referral to orthopedic hand surgery for surgical management was made. Still in process and patient has not yet heard from surgery.  Patient running a fever or have signs/symptoms of infection? No  Patient currently on antibiotics for the treatment of infection? No  Patient have any upcoming invasive procedures/surgeries? No  Objective: CMP     Component Value Date/Time   NA 139 04/30/2022 1139   K 4.0 04/30/2022 1139   CL 104 04/30/2022 1139   CO2 27 04/30/2022 1139   GLUCOSE 99 04/30/2022 1139   BUN 12 04/30/2022 1139   CREATININE 0.49 (L) 04/30/2022 1139   CALCIUM 9.6 04/30/2022 1139   PROT 6.8 04/30/2022 1139   ALBUMIN 3.8 02/05/2021 0308   AST 18 04/30/2022 1139   ALT 22 04/30/2022 1139   ALKPHOS 76 02/05/2021 0308   BILITOT 0.8 04/30/2022 1139   GFRNONAA >60 02/05/2021 0308   GFRAA >60 04/16/2018 0506    CBC    Component Value Date/Time   WBC 8.1 04/30/2022 1139   RBC 5.13 (H) 04/30/2022 1139   HGB 13.9 04/30/2022 1139   HCT 43.1 04/30/2022 1139   PLT 358 04/30/2022 1139   MCV 84.0 04/30/2022 1139   MCH 27.1 04/30/2022 1139   MCHC 32.3 04/30/2022 1139   RDW 14.0 04/30/2022 1139   LYMPHSABS 1,863 04/30/2022 1139   MONOABS 0.3 04/16/2018 0506   EOSABS 32 04/30/2022 1139   BASOSABS 16 04/30/2022 1139    Baseline Immunosuppressant Therapy Labs TB GOLD    Latest Ref Rng & Units 04/30/2022   11:39 AM  Quantiferon TB Gold  Quantiferon TB Gold Plus NEGATIVE POSITIVE   Hepatitis Panel    Latest Ref Rng & Units 11/22/2018   10:25 AM  Hepatitis  Hep B Surface Ag NON-REACTI NON-REACTIVE   Hep C Ab NON-REACTI NON-REACTIVE   Hep A IgM NON-REACTI NON-REACTIVE    HIV Lab Results  Component Value Date   HIV Non Reactive 04/13/2018   Immunoglobulins    Latest Ref Rng & Units 11/22/2018   10:25 AM   Immunoglobulin Electrophoresis  IgA  47 - 310 mg/dL 47 - 528 mg/dL 413    244   IgG 010 - 1,640 mg/dL 2,725   IgM 50 - 366 mg/dL 440    SPEP    Latest Ref Rng & Units 04/30/2022   11:39 AM  Serum Protein Electrophoresis  Total Protein 6.1 - 8.1 g/dL 6.8    H4VQ No results found for: "G6PDH" TPMT No results found for: "TPMT"   Chest x-ray: 05/09/22 - No active cardiopulmonary disease.  Assessment/Plan:  Reviewed importance of holding HUMIRA with signs/symptoms of an infections, if antibiotics are prescribed to treat an active infection, and with invasive procedures (including any orthopedic procedure that may be anticipated for her hands)  Demonstrated proper injection technique with Humira demo device  Patient able to demonstrate proper injection technique using the teach back method.  Patient self injected in the right upper thigh with:  Sample Medication: Humira 40mg /0.4 mL autoinjector pen NDC: Lot: [id number] Expiration: 02/2023  Patient tolerated well.  Observed for 30 mins in office for adverse reaction and none noted.   Patient is to return in 1 month for labs and 6-8 weeks for follow-up appointment.  Standing orders placed. Patient had positive TB gold with normal chest  x-ray. Patient states she completed TB gold treatment x 3 months when she first immigrated to [place]  Humira approved through patient assistance .   Rx sent to: [facility] for Humira: [phone].  Patient provided with pharmacy phone number and advised to call later this week to schedule shipment to home.  Patient will continue Humira 40mg  SQ every 14 days. She is expected to receive shipment from [facility] on 06/04/22. Provided her with one additional sample of Humira for dose that is due on 06/01/22 to prevent delay in treatment.  Rx for prednisone taper sent to pharmacy today dosed as follows: 20mg  daily x 4 days, 15mg  daily x 4 days, 10mg  daily x 4 days, then 5mg  daily x 4 days  All  questions encouraged and answered.  Instructed patient to call with any further questions or concerns.  Knox Saliva, PharmD, MPH, BCPS, CPP Clinical Pharmacist (Rheumatology and Pulmonology)  05/18/2022 8:12 AM

## 2022-06-17 NOTE — Progress Notes (Signed)
Office Visit Note  Patient: Sydney Sandoval             Date of Birth: May 17, 1971           MRN: [id number]             PCP: Sandre Kitty, PA-C Referring: Sandre Kitty, PA-C Visit Date: 06/30/2022   Subjective:  Follow-up (Patient is wondering wether she can take vitaman B-12 with Humira. Patient states she is feeling pretty good. Some pain in hands and wrists.)   History of Present Illness: Sydney Sandoval is a 51 y.o. 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 She has taken 3 doses total and feels a large improvement in symptoms. Still has some swelling in both hands. She saw Dr. Roda Shutters and then Dr. Merlyn Lot for evaluation. Currently also has some eye irritation she thinks is from a lash or maybe dry air and wind, but getting better since 2 days ago.  Encounter was conducted with assistance of in person Spanish language interpreter.  Previous HPI 04/30/2022 Sydney Sandoval is a 51 y.o. female here for rheumatoid arthritis. She was previously diagnosed in 2016 and prescribed treatment with methotrexate. She apparently stopped this due to LFT abnormalities. She was also treated also with prednisone during some portion of the time. No regular rheumatology follow and intermittent with PCP due to insurance.  She has taken some NSAIDs during these times for treating her symptoms when off medication.  More recently she has been seeing a doctor helping control symptoms with the monthly intramuscular betamethasone injection. These shots improve symptoms for about 2-3 weeks but she has worsening of pain in between treatments. Her last injection was about a week ago and symptoms are minimal at this time as a result. More recently she was exercising doing some kind of handheld weight and felt a sudden shift in the tendons on back of her right hand and since then cannot fully extend her 4th and 5th finger. Also with  increased swelling at the dorsal side of the wrist.  She also has eye symptoms with pain but no major visual acuity changes. She was treated with topical drops and plugs for dryness but also concern for scleritis or uveitis associated with RA and treated with steroid eyedrops.   12/2019 RF 401   10/2018 HAV/HBV/HCV neg   Activities of Daily Living:  Patient reports morning stiffness for 2 hours.   Patient Reports nocturnal pain.  Difficulty dressing/grooming: Reports Difficulty climbing stairs: Reports Difficulty getting out of chair: Reports Difficulty using hands for taps, buttons, cutlery, and/or writing: Reports     Review of Systems  Constitutional:  Negative for fatigue.  HENT:  Negative for mouth sores and mouth dryness.   Eyes:  Positive for dryness.  Respiratory:  Negative for shortness of breath.   Cardiovascular:  Negative for chest pain and palpitations.  Gastrointestinal:  Negative for blood in stool, constipation and diarrhea.  Endocrine: Negative for increased urination.  Genitourinary:  Negative for involuntary urination.  Musculoskeletal:  Negative for joint pain, gait problem, joint pain, joint swelling, myalgias, muscle weakness, morning stiffness, muscle tenderness and myalgias.  Skin:  Negative for color change, rash, hair loss and sensitivity to sunlight.  Allergic/Immunologic: Negative for susceptible to infections.  Neurological:  Negative for dizziness and headaches.  Hematological:  Negative for swollen glands.  Psychiatric/Behavioral:  Negative for depressed mood and sleep disturbance.  The patient is not nervous/anxious.     PMFS History:  Patient Active Problem List   Diagnosis Date Noted   Rheumatoid arthritis with rheumatoid factor of multiple sites without organ or systems involvement ([place]) 04/30/2022   High risk medication use 04/30/2022   Injury of extensor tendon of right hand 04/30/2022   Eye inflammation 04/30/2022   Elevated LFTs  04/12/2018   Jaundice 04/12/2018   Acute cholecystitis 04/12/2018   Microcytic anemia 04/12/2018   Hypokalemia 04/12/2018    History reviewed. No pertinent past medical history.  Family History  Problem Relation Age of Onset   Diabetes Mother    Diabetes Sister    Healthy Son    Healthy Daughter    Healthy Daughter    Colon cancer Neg Hx    Past Surgical History:  Procedure Laterality Date   CESAREAN SECTION     2 c-sections   CHOLECYSTECTOMY N/A 04/14/2018   Procedure: LAPAROSCOPIC CHOLECYSTECTOMY WITH INTRAOPERATIVE CHOLANGIOGRAM;  Surgeon: Clovis Riley, MD;  Location: [facility];  Service: General;  Laterality: N/A;   ERCP N/A 04/15/2018   Procedure: ENDOSCOPIC RETROGRADE CHOLANGIOPANCREATOGRAPHY (ERCP);  Surgeon: Carol Ada, MD;  Location: [facility];  Service: Endoscopy;  Laterality: N/A;   GIVENS CAPSULE STUDY N/A 03/23/2019   Procedure: GIVENS CAPSULE STUDY;  Surgeon: Lavena Bullion, DO;  Location: [facility];  Service: Gastroenterology;  Laterality: N/A;   REMOVAL OF STONES  04/15/2018   Procedure: REMOVAL OF STONES;  Surgeon: Carol Ada, MD;  Location: [facility];  Service: Endoscopy;;   SPHINCTEROTOMY  04/15/2018   Procedure: Joan Mayans;  Surgeon: Carol Ada, MD;  Location: [facility];  Service: Endoscopy;;   Social History   Social History Narrative   Not on file    There is no immunization history on file for this patient.   Objective: Vital Signs: BP 109/74 (BP Location: Left Arm, Patient Position: Sitting, Cuff Size: Normal)   Pulse 69   Resp 15   Ht 5' (1.524 m)   Wt 155 lb 6.4 oz (70.5 kg)   BMI 30.35 kg/m    Physical Exam Constitutional:      Appearance: She is obese.  Eyes:     Comments: Right eye conjunctival injection, no periorbital swelling  Cardiovascular:     Rate and Rhythm: Normal rate and regular rhythm.  Pulmonary:     Effort: Pulmonary effort is normal.     Breath sounds: Normal breath sounds.  Neurological:      Mental Status: She is alert.  Psychiatric:        Mood and Affect: Mood normal.      Musculoskeletal Exam:  Neck full ROM no tenderness Shoulders full ROM no tenderness or swelling Elbows full ROM no tenderness or swelling Wrists swelling bilaterally, right wrist with severe subluxation on ulnar side, nodule and swelling on dorsal side, swelling with mild tenderness at 3rd MCP, swan neck deformity in multiple fingers Right 4th and 5th fingers unable to fully extend but passive ROM intact, grip strength grossly intact,  Knees full ROM no tenderness or swelling Ankles full ROM no tenderness or swelling   CDAI Exam: CDAI Score: 10  Patient Global: 20 mm; Provider Global: 40 mm Swollen: 3 ; Tender: 1  Joint Exam 06/30/2022      Right  Left  Wrist  Swollen Tender  Swollen   MCP 3  Swollen         Investigation: No additional findings.  Imaging: No results found.  Recent  Labs: Lab Results  Component Value Date   WBC 8.1 04/30/2022   HGB 13.9 04/30/2022   PLT 358 04/30/2022   NA 139 04/30/2022   K 4.0 04/30/2022   CL 104 04/30/2022   CO2 27 04/30/2022   GLUCOSE 99 04/30/2022   BUN 12 04/30/2022   CREATININE 0.49 (L) 04/30/2022   BILITOT 0.8 04/30/2022   ALKPHOS 76 02/05/2021   AST 18 04/30/2022   ALT 22 04/30/2022   PROT 6.8 04/30/2022   ALBUMIN 3.8 02/05/2021   CALCIUM 9.6 04/30/2022   GFRAA >60 04/16/2018   QFTBGOLDPLUS POSITIVE (A) 04/30/2022    Speciality Comments: Humira started 05/18/22  Procedures:  No procedures performed Allergies: Patient has no known allergies.   Assessment / Plan:     Visit Diagnoses: Rheumatoid arthritis with rheumatoid factor of multiple sites without organ or systems involvement (HCC) - Plan: C-reactive protein  Definitely seeing some symptom improvement but not in disease remission but is recently following up after starting Humira.  Checking CRP for disease activity monitoring.  Plan to continue Humira 40 mg subcu q. 14  days.  High risk medication use - Plan: CBC with Differential/Platelet, COMPLETE METABOLIC PANEL WITH GFR  Checking CBC and CMP for medication monitoring after Humira start.  No injection site reactions or other intolerance problem reported.  Injury of extensor tendon of right hand, initial encounter  She has seen Dr. Merlyn Lot for evaluation of extensor tendon ruptures on the right hand.  Will work on getting inflammatory disease under control as best possible and she is using a wrist splint for support in the meantime.  Eye inflammation  Currently having some eye irritation with this is related to getting lash or eye product on the surface.  Does not have history of inflammatory eye disease involvement.  Orders: Orders Placed This Encounter  Procedures   CBC with Differential/Platelet   COMPLETE METABOLIC PANEL WITH GFR   C-reactive protein   No orders of the defined types were placed in this encounter.    Follow-Up Instructions: No follow-ups on file.   Fuller Plan, MD  Note - This record has been created using [employer].  Chart creation errors have been sought, but may not always  have been located. Such creation errors do not reflect on  the standard of medical care.

## 2022-06-30 ENCOUNTER — Encounter: Payer: Self-pay | Admitting: Internal Medicine

## 2022-06-30 ENCOUNTER — Ambulatory Visit: Payer: Self-pay | Attending: Internal Medicine | Admitting: Internal Medicine

## 2022-06-30 VITALS — BP 109/74 | HR 69 | Resp 15 | Ht 60.0 in | Wt 155.4 lb

## 2022-06-30 DIAGNOSIS — M0579 Rheumatoid arthritis with rheumatoid factor of multiple sites without organ or systems involvement: Secondary | ICD-10-CM

## 2022-06-30 DIAGNOSIS — Z79899 Other long term (current) drug therapy: Secondary | ICD-10-CM

## 2022-06-30 DIAGNOSIS — S66901A Unspecified injury of unspecified muscle, fascia and tendon at wrist and hand level, right hand, initial encounter: Secondary | ICD-10-CM

## 2022-06-30 DIAGNOSIS — H5789 Other specified disorders of eye and adnexa: Secondary | ICD-10-CM

## 2022-07-01 LAB — CBC WITH DIFFERENTIAL/PLATELET
Absolute Monocytes: 594 cells/uL (ref 200–950)
Basophils Absolute: 21 cells/uL (ref 0–200)
Basophils Relative: 0.4 %
Eosinophils Absolute: 111 cells/uL (ref 15–500)
Eosinophils Relative: 2.1 %
HCT: 43.3 % (ref 35.0–45.0)
Hemoglobin: 13.7 g/dL (ref 11.7–15.5)
Lymphs Abs: 2014 cells/uL (ref 850–3900)
MCH: 26.7 pg — ABNORMAL LOW (ref 27.0–33.0)
MCHC: 31.6 g/dL — ABNORMAL LOW (ref 32.0–36.0)
MCV: 84.4 fL (ref 80.0–100.0)
MPV: 10.5 fL (ref 7.5–12.5)
Monocytes Relative: 11.2 %
Neutro Abs: 2560 cells/uL (ref 1500–7800)
Neutrophils Relative %: 48.3 %
Platelets: 329 10*3/uL (ref 140–400)
RBC: 5.13 10*6/uL — ABNORMAL HIGH (ref 3.80–5.10)
RDW: 13 % (ref 11.0–15.0)
Total Lymphocyte: 38 %
WBC: 5.3 10*3/uL (ref 3.8–10.8)

## 2022-07-01 LAB — COMPLETE METABOLIC PANEL WITH GFR
AG Ratio: 1.4 (calc) (ref 1.0–2.5)
ALT: 41 U/L — ABNORMAL HIGH (ref 6–29)
AST: 39 U/L — ABNORMAL HIGH (ref 10–35)
Albumin: 4.2 g/dL (ref 3.6–5.1)
Alkaline phosphatase (APISO): 100 U/L (ref 31–125)
BUN/Creatinine Ratio: 32 (calc) — ABNORMAL HIGH (ref 6–22)
BUN: 14 mg/dL (ref 7–25)
CO2: 27 mmol/L (ref 20–32)
Calcium: 9.6 mg/dL (ref 8.6–10.2)
Chloride: 103 mmol/L (ref 98–110)
Creat: 0.44 mg/dL — ABNORMAL LOW (ref 0.50–0.99)
Globulin: 2.9 g/dL (calc) (ref 1.9–3.7)
Glucose, Bld: 95 mg/dL (ref 65–99)
Potassium: 4.3 mmol/L (ref 3.5–5.3)
Sodium: 139 mmol/L (ref 135–146)
Total Bilirubin: 0.8 mg/dL (ref 0.2–1.2)
Total Protein: 7.1 g/dL (ref 6.1–8.1)
eGFR: 118 mL/min/{1.73_m2} (ref >=60)

## 2022-07-01 LAB — C-REACTIVE PROTEIN: CRP: 7.1 mg/L (ref <8.0)

## 2022-07-01 NOTE — Progress Notes (Signed)
Lab test do show a small elevation in liver enzyme tests.  This can be a side effect of the Humira but but this is a small change and she has had abnormal test values in the past not associated with the medication.  If there is a trend when we recheck these in a few months we might have to change the treatment.

## 2022-09-16 ENCOUNTER — Other Ambulatory Visit: Payer: Self-pay | Admitting: *Deleted

## 2022-09-16 DIAGNOSIS — Z79899 Other long term (current) drug therapy: Secondary | ICD-10-CM

## 2022-09-16 DIAGNOSIS — M0579 Rheumatoid arthritis with rheumatoid factor of multiple sites without organ or systems involvement: Secondary | ICD-10-CM

## 2022-09-16 MED ORDER — HUMIRA (2 PEN) 40 MG/0.4ML ~~LOC~~ AJKT: 40.0000 mg | AUTO-INJECTOR | SUBCUTANEOUS | 0 refills | Status: DC

## 2022-09-16 NOTE — Telephone Encounter (Signed)
Refill request received via fax from [facility] for Humira   Next Visit: 10/05/2022  Last Visit: 06/30/2022  Last Fill: 05/18/2022  [id number] arthritis with rheumatoid factor of multiple sites without organ or systems involvement   Current Dose per office note 06/30/2022: Humira 40 mg subcu q. 14 days.   Labs: 06/30/2022 Lab test do show a small elevation in liver enzyme tests.  This can be a side effect of the Humira but but this is a small change and she has had abnormal test values in the past not associated with the medication.  If there is a trend when we recheck these in a few months we might have to change the treatment.   Chest X-ray: 05/08/2022 Negative   Okay to refill Humira?

## 2022-10-04 NOTE — Progress Notes (Unsigned)
Office Visit Note  Patient: Sydney Sandoval             Date of Birth: 1971-06-14           MRN: [id number]             PCP: Cathleen Corti, PA-C Referring: Cathleen Corti, PA-C Visit Date: 10/05/2022   Subjective:  No chief complaint on file.   History of Present Illness: Sydney Sandoval is a 52 y.o. female here for follow up ***   Previous HPI 06/30/22 Sydney Sandoval is a 52 y.o. 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 She has taken 3 doses total and feels a large improvement in symptoms. Still has some swelling in both hands. She saw Dr. Erlinda Hong and then Dr. Fredna Dow for evaluation. Currently also has some eye irritation she thinks is from a lash or maybe dry air and wind, but getting better since 2 days ago.   Encounter was conducted with assistance of in person Spanish language interpreter.   Previous HPI 04/30/2022 Sydney Sandoval is a 52 y.o. female here for rheumatoid arthritis. She was previously diagnosed in 2016 and prescribed treatment with methotrexate. She apparently stopped this due to LFT abnormalities. She was also treated also with prednisone during some portion of the time. No regular rheumatology follow and intermittent with PCP due to insurance.  She has taken some NSAIDs during these times for treating her symptoms when off medication.  More recently she has been seeing a doctor helping control symptoms with the monthly intramuscular betamethasone injection. These shots improve symptoms for about 2-3 weeks but she has worsening of pain in between treatments. Her last injection was about a week ago and symptoms are minimal at this time as a result. More recently she was exercising doing some kind of handheld weight and felt a sudden shift in the tendons on back of her right hand and since then cannot fully extend her 4th and 5th finger. Also with increased swelling at the dorsal side of the  wrist.  She also has eye symptoms with pain but no major visual acuity changes. She was treated with topical drops and plugs for dryness but also concern for scleritis or uveitis associated with RA and treated with steroid eyedrops.   12/2019 RF 401   10/2018 HAV/HBV/HCV neg   No Rheumatology ROS completed.   PMFS History:  Patient Active Problem List   Diagnosis Date Noted   Rheumatoid arthritis with rheumatoid factor of multiple sites without organ or systems involvement ([place]) 04/30/2022   High risk medication use 04/30/2022   Injury of extensor tendon of right hand 04/30/2022   Eye inflammation 04/30/2022   Elevated LFTs 04/12/2018   Jaundice 04/12/2018   Acute cholecystitis 04/12/2018   Microcytic anemia 04/12/2018   Hypokalemia 04/12/2018    No past medical history on file.  Family History  Problem Relation Age of Onset   Diabetes Mother    Diabetes Sister    Healthy Son    Healthy Daughter    Healthy Daughter    Colon cancer Neg Hx    Past Surgical History:  Procedure Laterality Date   CESAREAN SECTION     2 c-sections   CHOLECYSTECTOMY N/A 04/14/2018   Procedure: LAPAROSCOPIC CHOLECYSTECTOMY WITH INTRAOPERATIVE CHOLANGIOGRAM;  Surgeon: Clovis Riley, MD;  Location: [facility];  Service: General;  Laterality: N/A;   ERCP N/A 04/15/2018  Procedure: ENDOSCOPIC RETROGRADE CHOLANGIOPANCREATOGRAPHY (ERCP);  Surgeon: Carol Ada, MD;  Location: [facility];  Service: Endoscopy;  Laterality: N/A;   GIVENS CAPSULE STUDY N/A 03/23/2019   Procedure: GIVENS CAPSULE STUDY;  Surgeon: Lavena Bullion, DO;  Location: [facility];  Service: Gastroenterology;  Laterality: N/A;   REMOVAL OF STONES  04/15/2018   Procedure: REMOVAL OF STONES;  Surgeon: Carol Ada, MD;  Location: [facility];  Service: Endoscopy;;   SPHINCTEROTOMY  04/15/2018   Procedure: Joan Mayans;  Surgeon: Carol Ada, MD;  Location: [facility];  Service: Endoscopy;;   Social History   Social  History Narrative   Not on file    There is no immunization history on file for this patient.   Objective: Vital Signs: There were no vitals taken for this visit.   Physical Exam   Musculoskeletal Exam: ***  CDAI Exam: CDAI Score: -- Patient Global: --; Provider Global: -- Swollen: --; Tender: -- Joint Exam 10/05/2022   No joint exam has been documented for this visit   There is currently no information documented on the homunculus. Go to the Rheumatology activity and complete the homunculus joint exam.  Investigation: No additional findings.  Imaging: No results found.  Recent Labs: Lab Results  Component Value Date   WBC 5.3 06/30/2022   HGB 13.7 06/30/2022   PLT 329 06/30/2022   NA 139 06/30/2022   K 4.3 06/30/2022   CL 103 06/30/2022   CO2 27 06/30/2022   GLUCOSE 95 06/30/2022   BUN 14 06/30/2022   CREATININE 0.44 (L) 06/30/2022   BILITOT 0.8 06/30/2022   ALKPHOS 76 02/05/2021   AST 39 (H) 06/30/2022   ALT 41 (H) 06/30/2022   PROT 7.1 06/30/2022   ALBUMIN 3.8 02/05/2021   CALCIUM 9.6 06/30/2022   GFRAA >60 04/16/2018   QFTBGOLDPLUS POSITIVE (A) 04/30/2022    Speciality Comments: Humira started 05/18/22  Procedures:  No procedures performed Allergies: Patient has no known allergies.   Assessment / Plan:     Visit Diagnoses: No diagnosis found.  ***  Orders: No orders of the defined types were placed in this encounter.  No orders of the defined types were placed in this encounter.    Follow-Up Instructions: No follow-ups on file.   Collier Salina, MD  Note - This record has been created using [employer].  Chart creation errors have been sought, but may not always  have been located. Such creation errors do not reflect on  the standard of medical care.

## 2022-10-05 ENCOUNTER — Ambulatory Visit: Payer: Self-pay | Attending: Internal Medicine | Admitting: Internal Medicine

## 2022-10-05 ENCOUNTER — Encounter: Payer: Self-pay | Admitting: Internal Medicine

## 2022-10-05 VITALS — BP 111/74 | HR 91 | Resp 12 | Ht 60.0 in | Wt 147.0 lb

## 2022-10-05 DIAGNOSIS — Z79899 Other long term (current) drug therapy: Secondary | ICD-10-CM

## 2022-10-05 DIAGNOSIS — M0579 Rheumatoid arthritis with rheumatoid factor of multiple sites without organ or systems involvement: Secondary | ICD-10-CM

## 2022-10-14 ENCOUNTER — Other Ambulatory Visit: Payer: Self-pay

## 2022-10-14 DIAGNOSIS — M0579 Rheumatoid arthritis with rheumatoid factor of multiple sites without organ or systems involvement: Secondary | ICD-10-CM

## 2022-10-14 DIAGNOSIS — Z79899 Other long term (current) drug therapy: Secondary | ICD-10-CM

## 2022-10-15 LAB — CBC WITH DIFFERENTIAL/PLATELET
Absolute Monocytes: 1148 cells/uL — ABNORMAL HIGH (ref 200–950)
Basophils Absolute: 21 cells/uL (ref 0–200)
Basophils Relative: 0.3 %
Eosinophils Absolute: 154 cells/uL (ref 15–500)
Eosinophils Relative: 2.2 %
HCT: 36.5 % (ref 35.0–45.0)
Hemoglobin: 12.3 g/dL (ref 11.7–15.5)
Lymphs Abs: 2296 cells/uL (ref 850–3900)
MCH: 27.3 pg (ref 27.0–33.0)
MCHC: 33.7 g/dL (ref 32.0–36.0)
MCV: 81.1 fL (ref 80.0–100.0)
MPV: 10.8 fL (ref 7.5–12.5)
Monocytes Relative: 16.4 %
Neutro Abs: 3381 cells/uL (ref 1500–7800)
Neutrophils Relative %: 48.3 %
Platelets: 342 10*3/uL (ref 140–400)
RBC: 4.5 10*6/uL (ref 3.80–5.10)
RDW: 12 % (ref 11.0–15.0)
Total Lymphocyte: 32.8 %
WBC: 7 10*3/uL (ref 3.8–10.8)

## 2022-10-15 LAB — COMPLETE METABOLIC PANEL WITH GFR
AG Ratio: 1.2 (calc) (ref 1.0–2.5)
ALT: 27 U/L (ref 6–29)
AST: 22 U/L (ref 10–35)
Albumin: 3.7 g/dL (ref 3.6–5.1)
Alkaline phosphatase (APISO): 89 U/L (ref 31–125)
BUN/Creatinine Ratio: 38 (calc) — ABNORMAL HIGH (ref 6–22)
BUN: 14 mg/dL (ref 7–25)
CO2: 28 mmol/L (ref 20–32)
Calcium: 9.4 mg/dL (ref 8.6–10.2)
Chloride: 103 mmol/L (ref 98–110)
Creat: 0.37 mg/dL — ABNORMAL LOW (ref 0.50–0.99)
Globulin: 3.2 g/dL (calc) (ref 1.9–3.7)
Glucose, Bld: 114 mg/dL — ABNORMAL HIGH (ref 65–99)
Potassium: 4.5 mmol/L (ref 3.5–5.3)
Sodium: 138 mmol/L (ref 135–146)
Total Bilirubin: 0.5 mg/dL (ref 0.2–1.2)
Total Protein: 6.9 g/dL (ref 6.1–8.1)
eGFR: 124 mL/min/{1.73_m2} (ref >=60)

## 2022-10-15 LAB — SEDIMENTATION RATE: Sed Rate: 67 mm/h — ABNORMAL HIGH (ref 0–20)

## 2023-01-02 NOTE — Progress Notes (Unsigned)
Office Visit Note  Patient: Sydney Sandoval             Date of Birth: 1971/02/03           MRN: [id number]             PCP: Sandre Kitty, PA-C Referring: Sandre Kitty, PA-C Visit Date: 01/04/2023   Subjective:  No chief complaint on file.   History of Present Illness: Sydney Sandoval is a 52 y.o. female here for follow up ***   Previous HPI 10/05/22 Sydney Sandoval is a 52 y.o. 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 14 days.  Continuing the treatment since her last visit she feels like symptoms are continuing to do better.  Does not have prolonged morning stiffness or much new hand swelling does not feel like there were any flareups.  She did have a prolonged upper respiratory illness course in December with about a month total of coughing and symptoms but this improved without any medical intervention and did not require antibiotic treatment.     Previous HPI 06/30/22 Sydney Sandoval is a 52 y.o. 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 She has taken 3 doses total and feels a large improvement in symptoms. Still has some swelling in both hands. She saw Dr. Roda Shutters and then Dr. Merlyn Lot for evaluation. Currently also has some eye irritation she thinks is from a lash or maybe dry air and wind, but getting better since 2 days ago.   Encounter was conducted with assistance of in person Spanish language interpreter.   Previous HPI 04/30/2022 Sydney Sandoval is a 52 y.o. female here for rheumatoid arthritis. She was previously diagnosed in 2016 and prescribed treatment with methotrexate. She apparently stopped this due to LFT abnormalities. She was also treated also with prednisone during some portion of the time. No regular rheumatology follow and intermittent with PCP due to insurance.  She has taken some NSAIDs during  these times for treating her symptoms when off medication.  More recently she has been seeing a doctor helping control symptoms with the monthly intramuscular betamethasone injection. These shots improve symptoms for about 2-3 weeks but she has worsening of pain in between treatments. Her last injection was about a week ago and symptoms are minimal at this time as a result. More recently she was exercising doing some kind of handheld weight and felt a sudden shift in the tendons on back of her right hand and since then cannot fully extend her 4th and 5th finger. Also with increased swelling at the dorsal side of the wrist.  She also has eye symptoms with pain but no major visual acuity changes. She was treated with topical drops and plugs for dryness but also concern for scleritis or uveitis associated with RA and treated with steroid eyedrops.   12/2019 RF 401   10/2018 HAV/HBV/HCV neg   No Rheumatology ROS completed.   PMFS History:  Patient Active Problem List   Diagnosis Date Noted   Rheumatoid arthritis with rheumatoid factor of multiple sites without organ or systems involvement ([facility]) 04/30/2022   High risk medication use 04/30/2022   Injury of extensor tendon of right hand 04/30/2022   Eye inflammation 04/30/2022   Elevated LFTs 04/12/2018   Jaundice 04/12/2018   Acute cholecystitis 04/12/2018   Microcytic anemia  04/12/2018   Hypokalemia 04/12/2018    No past medical history on file.  Family History  Problem Relation Age of Onset   Diabetes Mother    Diabetes Sister    Healthy Son    Healthy Daughter    Healthy Daughter    Colon cancer Neg Hx    Past Surgical History:  Procedure Laterality Date   CESAREAN SECTION     2 c-sections   CHOLECYSTECTOMY N/A 04/14/2018   Procedure: LAPAROSCOPIC CHOLECYSTECTOMY WITH INTRAOPERATIVE CHOLANGIOGRAM;  Surgeon: Berna Bue, MD;  Location: [facility];  Service: General;  Laterality: N/A;   ERCP N/A 04/15/2018   Procedure: ENDOSCOPIC  RETROGRADE CHOLANGIOPANCREATOGRAPHY (ERCP);  Surgeon: Jeani Hawking, MD;  Location: [facility];  Service: Endoscopy;  Laterality: N/A;   GIVENS CAPSULE STUDY N/A 03/23/2019   Procedure: GIVENS CAPSULE STUDY;  Surgeon: Shellia Cleverly, DO;  Location: [facility];  Service: Gastroenterology;  Laterality: N/A;   REMOVAL OF STONES  04/15/2018   Procedure: REMOVAL OF STONES;  Surgeon: Jeani Hawking, MD;  Location: [facility];  Service: Endoscopy;;   SPHINCTEROTOMY  04/15/2018   Procedure: Dennison Mascot;  Surgeon: Jeani Hawking, MD;  Location: [facility];  Service: Endoscopy;;   Social History   Social History Narrative   Not on file    There is no immunization history on file for this patient.   Objective: Vital Signs: There were no vitals taken for this visit.   Physical Exam   Musculoskeletal Exam: ***  CDAI Exam: CDAI Score: -- Patient Global: --; Provider Global: -- Swollen: --; Tender: -- Joint Exam 01/04/2023   No joint exam has been documented for this visit   There is currently no information documented on the homunculus. Go to the Rheumatology activity and complete the homunculus joint exam.  Investigation: No additional findings.  Imaging: No results found.  Recent Labs: Lab Results  Component Value Date   WBC 7.0 10/14/2022   HGB 12.3 10/14/2022   PLT 342 10/14/2022   NA 138 10/14/2022   K 4.5 10/14/2022   CL 103 10/14/2022   CO2 28 10/14/2022   GLUCOSE 114 (H) 10/14/2022   BUN 14 10/14/2022   CREATININE 0.37 (L) 10/14/2022   BILITOT 0.5 10/14/2022   ALKPHOS 76 02/05/2021   AST 22 10/14/2022   ALT 27 10/14/2022   PROT 6.9 10/14/2022   ALBUMIN 3.8 02/05/2021   CALCIUM 9.4 10/14/2022   GFRAA >60 04/16/2018   QFTBGOLDPLUS POSITIVE (A) 04/30/2022    Speciality Comments: Humira started 05/18/22  Procedures:  No procedures performed Allergies: Patient has no known allergies.   Assessment / Plan:     Visit Diagnoses: No diagnosis  found.  ***  Orders: No orders of the defined types were placed in this encounter.  No orders of the defined types were placed in this encounter.    Follow-Up Instructions: No follow-ups on file.   Fuller Plan, MD  Note - This record has been created using [employer].  Chart creation errors have been sought, but may not always  have been located. Such creation errors do not reflect on  the standard of medical care.

## 2023-01-04 ENCOUNTER — Ambulatory Visit: Payer: Self-pay | Attending: Internal Medicine | Admitting: Internal Medicine

## 2023-01-04 ENCOUNTER — Encounter: Payer: Self-pay | Admitting: Internal Medicine

## 2023-01-04 VITALS — BP 105/71 | HR 77 | Resp 12 | Ht 60.0 in | Wt 155.0 lb

## 2023-01-04 DIAGNOSIS — S66901S Unspecified injury of unspecified muscle, fascia and tendon at wrist and hand level, right hand, sequela: Secondary | ICD-10-CM

## 2023-01-04 DIAGNOSIS — M0579 Rheumatoid arthritis with rheumatoid factor of multiple sites without organ or systems involvement: Secondary | ICD-10-CM

## 2023-01-04 DIAGNOSIS — Z79899 Other long term (current) drug therapy: Secondary | ICD-10-CM

## 2023-01-04 MED ORDER — PREDNISONE 5 MG PO TABS: ORAL_TABLET | ORAL | 0 refills | Status: AC

## 2023-01-04 MED ORDER — HUMIRA (2 PEN) 40 MG/0.4ML ~~LOC~~ AJKT: 40.0000 mg | AUTO-INJECTOR | SUBCUTANEOUS | 0 refills | Status: AC

## 2023-02-15 ENCOUNTER — Other Ambulatory Visit: Payer: Self-pay | Admitting: *Deleted

## 2023-02-15 ENCOUNTER — Telehealth: Payer: Self-pay

## 2023-02-15 DIAGNOSIS — M0579 Rheumatoid arthritis with rheumatoid factor of multiple sites without organ or systems involvement: Secondary | ICD-10-CM

## 2023-02-15 MED ORDER — PREDNISONE 5 MG PO TABS: ORAL_TABLET | ORAL | 0 refills | Status: AC

## 2023-02-15 NOTE — Telephone Encounter (Signed)
I sent a new prescription to [facility] for 12 day taper. She really needs to get back on the Humira consistently and not just continue prednisone off and on or can develop additional damage.

## 2023-02-15 NOTE — Telephone Encounter (Signed)
Patient is requesting prednisone, patient has bil hands and feet swelling. Patient hasn't taken Humira since 10/2022. Patient was given number to pharmacy to have Humira shipped to patient.

## 2023-02-15 NOTE — Addendum Note (Signed)
Addended by: Fuller Plan on: 02/15/2023 04:26 PM   Modules accepted: Orders

## 2023-02-15 NOTE — Telephone Encounter (Signed)
Patient requesting medication refill for Prednisone be sent to [facility]  [address] [name] [address] and said she still has not received Humira which was mailed to her previously. If any questions please call [phone]

## 2023-02-15 NOTE — Telephone Encounter (Signed)
I called patient, patient verbalized understanding. 

## 2023-03-25 NOTE — Progress Notes (Deleted)
Office Visit Note  Patient: Sydney Sandoval             Date of Birth: [birth year]           MRN: [id number]             PCP: Sandre Kitty, PA-C Referring: Sandre Kitty, PA-C Visit Date: 04/06/2023   Subjective:  No chief complaint on file.   History of Present Illness: Sydney Sandoval is a 52 y.o. female here for follow up for seropositive, erosive RA complicated by extensor tendon rupture.     Previous HPI 01/04/2023 Sydney Sandoval is a 52 y.o. female here for follow up for seropositive, erosive RA complicated by extensor tendon rupture. She was taking Humira 40 mg [place] q14days but off the medication since 2nd week of March due to running out of doses. She was not sure about requesting refill for this or intentional so has remained off the past 2 months with worsening symptoms. Morning stiffness and pain lasting about 3 hours daily. Increased hand and knee swelling, and shoulder pain worse lying on that side. She took some prednisone that she had at home which was partially effective, but out of this as well now.  Patient's daughter was present at encounter today as well.     Encounter was conducted with assistance of in person Spanish language interpreter.   Previous HPI 10/05/22 Sydney Sandoval is a 52 y.o. 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 14 days.  Continuing the treatment since her last visit she feels like symptoms are continuing to do better.  Does not have prolonged morning stiffness or much new hand swelling does not feel like there were any flareups.  She did have a prolonged upper respiratory illness course in December with about a month total of coughing and symptoms but this improved without any medical intervention and did not require antibiotic treatment.     Previous HPI 06/30/22 Sydney Sandoval is a 52 y.o. female here for follow up for seropositive, erosive RA complicated by  right hand extensor tendon rupture now on Humira 40 mg [place] q14days. She has taken 3 doses total and feels a large improvement in symptoms. Still has some swelling in both hands. She saw Dr. Roda Shutters and then Dr. Merlyn Lot for evaluation. Currently also has some eye irritation she thinks is from a lash or maybe dry air and wind, but getting better since 2 days ago.     Previous HPI 04/30/2022 Sydney Sandoval is a 52 y.o. female here for rheumatoid arthritis. She was previously diagnosed in 2016 and prescribed treatment with methotrexate. She apparently stopped this due to LFT abnormalities. She was also treated also with prednisone during some portion of the time. No regular rheumatology follow and intermittent with PCP due to insurance.  She has taken some NSAIDs during these times for treating her symptoms when off medication.  More recently she has been seeing a doctor helping control symptoms with the monthly intramuscular betamethasone injection. These shots improve symptoms for about 2-3 weeks but she has worsening of pain in between treatments. Her last injection was about a week ago and symptoms are minimal at this time as a result. More recently she was exercising doing some kind of handheld weight and felt a sudden shift in the tendons on back of her right hand and since then cannot fully extend her 4th and 5th finger. Also  with increased swelling at the dorsal side of the wrist.  She also has eye symptoms with pain but no major visual acuity changes. She was treated with topical drops and plugs for dryness but also concern for scleritis or uveitis associated with RA and treated with steroid eyedrops.   12/2019 RF 401   10/2018 HAV/HBV/HCV neg   No Rheumatology ROS completed.   PMFS History:  Patient Active Problem List   Diagnosis Date Noted   Rheumatoid arthritis with rheumatoid factor of multiple sites without organ or systems involvement ([facility]) 04/30/2022   High risk medication use 04/30/2022    Injury of extensor tendon of right hand 04/30/2022   Eye inflammation 04/30/2022   Elevated LFTs 04/12/2018   Jaundice 04/12/2018   Acute cholecystitis 04/12/2018   Microcytic anemia 04/12/2018   Hypokalemia 04/12/2018    No past medical history on file.  Family History  Problem Relation Age of Onset   Diabetes Mother    Diabetes Sister    Healthy Son    Healthy Daughter    Healthy Daughter    Colon cancer Neg Hx    Past Surgical History:  Procedure Laterality Date   CESAREAN SECTION     2 c-sections   CHOLECYSTECTOMY N/A 04/14/2018   Procedure: LAPAROSCOPIC CHOLECYSTECTOMY WITH INTRAOPERATIVE CHOLANGIOGRAM;  Surgeon: Berna Bue, MD;  Location: [facility];  Service: General;  Laterality: N/A;   ERCP N/A 04/15/2018   Procedure: ENDOSCOPIC RETROGRADE CHOLANGIOPANCREATOGRAPHY (ERCP);  Surgeon: Jeani Hawking, MD;  Location: [facility];  Service: Endoscopy;  Laterality: N/A;   GIVENS CAPSULE STUDY N/A 03/23/2019   Procedure: GIVENS CAPSULE STUDY;  Surgeon: Shellia Cleverly, DO;  Location: [facility];  Service: Gastroenterology;  Laterality: N/A;   REMOVAL OF STONES  04/15/2018   Procedure: REMOVAL OF STONES;  Surgeon: Jeani Hawking, MD;  Location: [facility];  Service: Endoscopy;;   SPHINCTEROTOMY  04/15/2018   Procedure: Dennison Mascot;  Surgeon: Jeani Hawking, MD;  Location: [facility];  Service: Endoscopy;;   Social History   Social History Narrative   Not on file    There is no immunization history on file for this patient.   Objective: Vital Signs: There were no vitals taken for this visit.   Physical Exam   Musculoskeletal Exam: ***  CDAI Exam: CDAI Score: -- Patient Global: --; Provider Global: -- Swollen: --; Tender: -- Joint Exam 04/06/2023   No joint exam has been documented for this visit   There is currently no information documented on the homunculus. Go to the Rheumatology activity and complete the homunculus joint exam.  Investigation: No  additional findings.  Imaging: No results found.  Recent Labs: Lab Results  Component Value Date   WBC 7.0 10/14/2022   HGB 12.3 10/14/2022   PLT 342 10/14/2022   NA 138 10/14/2022   K 4.5 10/14/2022   CL 103 10/14/2022   CO2 28 10/14/2022   GLUCOSE 114 (H) 10/14/2022   BUN 14 10/14/2022   CREATININE 0.37 (L) 10/14/2022   BILITOT 0.5 10/14/2022   ALKPHOS 76 02/05/2021   AST 22 10/14/2022   ALT 27 10/14/2022   PROT 6.9 10/14/2022   ALBUMIN 3.8 02/05/2021   CALCIUM 9.4 10/14/2022   GFRAA >60 04/16/2018   QFTBGOLDPLUS POSITIVE (A) 04/30/2022    Speciality Comments: Humira started 05/18/22  Procedures:  No procedures performed Allergies: Patient has no known allergies.   Assessment / Plan:     Visit Diagnoses: No diagnosis found.  ***  Orders: No  orders of the defined types were placed in this encounter.  No orders of the defined types were placed in this encounter.    Follow-Up Instructions: No follow-ups on file.   Metta Clines, RT  Note - This record has been created using [employer].  Chart creation errors have been sought, but may not always  have been located. Such creation errors do not reflect on  the standard of medical care.

## 2023-04-06 ENCOUNTER — Ambulatory Visit: Payer: Self-pay | Admitting: Internal Medicine

## 2023-04-06 DIAGNOSIS — M0579 Rheumatoid arthritis with rheumatoid factor of multiple sites without organ or systems involvement: Secondary | ICD-10-CM

## 2023-04-06 DIAGNOSIS — Z79899 Other long term (current) drug therapy: Secondary | ICD-10-CM

## 2023-04-06 DIAGNOSIS — S66901S Unspecified injury of unspecified muscle, fascia and tendon at wrist and hand level, right hand, sequela: Secondary | ICD-10-CM
